# Patient Record
Sex: Female | Born: 1975
Health system: Southern US, Community
[De-identification: ages and names within clinical notes are randomized; demographics above are authoritative.]

## PROBLEM LIST (undated history)

## (undated) DIAGNOSIS — I471 Supraventricular tachycardia, unspecified: Secondary | ICD-10-CM

## (undated) DIAGNOSIS — D573 Sickle-cell trait: Secondary | ICD-10-CM

## (undated) DIAGNOSIS — G43909 Migraine, unspecified, not intractable, without status migrainosus: Secondary | ICD-10-CM

## (undated) DIAGNOSIS — K589 Irritable bowel syndrome without diarrhea: Secondary | ICD-10-CM

## (undated) DIAGNOSIS — K219 Gastro-esophageal reflux disease without esophagitis: Secondary | ICD-10-CM

## (undated) HISTORY — PX: UTERINE FIBROID SURGERY: SHX826

## (undated) HISTORY — PX: COLONOSCOPY: SHX174

## (undated) HISTORY — PX: CHOLECYSTECTOMY: SHX55

## (undated) HISTORY — DX: Supraventricular tachycardia: I47.1

## (undated) HISTORY — DX: Sickle-cell trait: D57.3

## (undated) HISTORY — DX: Irritable bowel syndrome, unspecified: K58.9

## (undated) HISTORY — PX: ESOPHAGOGASTRODUODENOSCOPY: SHX1529

## (undated) HISTORY — DX: Supraventricular tachycardia, unspecified: I47.10

## (undated) HISTORY — PX: ABDOMINAL HYSTERECTOMY: SHX81

---

## 1997-05-30 HISTORY — PX: DILATION AND CURETTAGE OF UTERUS: SHX78

## 1999-01-25 ENCOUNTER — Other Ambulatory Visit: Admission: RE | Admit: 1999-01-25 | Discharge: 1999-01-25 | Payer: Self-pay | Admitting: Obstetrics and Gynecology

## 1999-07-14 ENCOUNTER — Encounter: Admission: RE | Admit: 1999-07-14 | Discharge: 1999-09-07 | Payer: Self-pay | Admitting: Obstetrics and Gynecology

## 1999-07-29 ENCOUNTER — Observation Stay (HOSPITAL_COMMUNITY): Admission: AD | Admit: 1999-07-29 | Discharge: 1999-07-29 | Payer: Self-pay | Admitting: Obstetrics and Gynecology

## 1999-07-30 ENCOUNTER — Observation Stay (HOSPITAL_COMMUNITY): Admission: AD | Admit: 1999-07-30 | Discharge: 1999-07-31 | Payer: Self-pay | Admitting: Obstetrics and Gynecology

## 1999-08-04 ENCOUNTER — Inpatient Hospital Stay (HOSPITAL_COMMUNITY): Admission: AD | Admit: 1999-08-04 | Discharge: 1999-08-04 | Payer: Self-pay | Admitting: Obstetrics and Gynecology

## 1999-08-04 ENCOUNTER — Inpatient Hospital Stay (HOSPITAL_COMMUNITY): Admission: AD | Admit: 1999-08-04 | Discharge: 1999-08-07 | Payer: Self-pay | Admitting: Obstetrics and Gynecology

## 1999-09-03 ENCOUNTER — Encounter: Admission: RE | Admit: 1999-09-03 | Discharge: 1999-12-02 | Payer: Self-pay | Admitting: Obstetrics and Gynecology

## 1999-09-21 ENCOUNTER — Other Ambulatory Visit: Admission: RE | Admit: 1999-09-21 | Discharge: 1999-09-21 | Payer: Self-pay | Admitting: Obstetrics and Gynecology

## 2000-10-13 ENCOUNTER — Other Ambulatory Visit: Admission: RE | Admit: 2000-10-13 | Discharge: 2000-10-13 | Payer: Self-pay | Admitting: Obstetrics and Gynecology

## 2003-05-31 HISTORY — PX: TUBAL LIGATION: SHX77

## 2014-03-19 ENCOUNTER — Emergency Department (HOSPITAL_COMMUNITY)
Admission: EM | Admit: 2014-03-19 | Discharge: 2014-03-19 | Disposition: A | Discharge status: Home / Self Care | Payer: 59 | Source: Home / Self Care | Attending: Family Medicine | Admitting: Family Medicine

## 2014-03-19 ENCOUNTER — Encounter (HOSPITAL_COMMUNITY): Payer: Self-pay | Admitting: Emergency Medicine

## 2014-03-19 DIAGNOSIS — J0100 Acute maxillary sinusitis, unspecified: Secondary | ICD-10-CM

## 2014-03-19 HISTORY — DX: Migraine, unspecified, not intractable, without status migrainosus: G43.909

## 2014-03-19 HISTORY — DX: Gastro-esophageal reflux disease without esophagitis: K21.9

## 2014-03-19 MED ORDER — MINOCYCLINE HCL 100 MG PO CAPS: 100.0000 mg | ORAL_CAPSULE | Freq: Two times a day (BID) | ORAL | Status: DC

## 2014-03-19 MED ORDER — IPRATROPIUM BROMIDE 0.06 % NA SOLN: 2.0000 | Freq: Four times a day (QID) | NASAL | Status: DC

## 2014-03-19 NOTE — ED Provider Notes (Signed)
CSN: [id number]     Arrival date & time 03/19/14  1656 History   First MD Initiated Contact with Patient 03/19/14 1714     Chief Complaint  Patient presents with  . Headache   (Consider location/radiation/quality/duration/timing/severity/associated sxs/prior Treatment) Patient is a 38 y.o. female presenting with headaches. The history is provided by the patient.  Headache Pain location:  Frontal Quality:  Dull Radiates to:  Does not radiate Onset quality:  Gradual Duration:  1 week Progression:  Worsening Chronicity:  New Similar to prior headaches: no   Associated symptoms: congestion, drainage, nausea and sinus pressure   Associated symptoms: no fever and no vomiting     Past Medical History  Diagnosis Date  . GERD (gastroesophageal reflux disease)   . Migraine headache    Past Surgical History  Procedure Laterality Date  . Cholecystectomy    . Abdominal hysterectomy     History reviewed. No pertinent family history. History  Substance Use Topics  . Smoking status: Never Smoker   . Smokeless tobacco: Not on file  . Alcohol Use: Yes   OB History   Grav Para Term Preterm Abortions TAB SAB Ect Mult Living                 Review of Systems  Constitutional: Negative.  Negative for fever.  HENT: Positive for congestion, postnasal drip, rhinorrhea and sinus pressure.   Cardiovascular: Negative.   Gastrointestinal: Positive for nausea. Negative for vomiting.  Skin: Negative.   Neurological: Positive for headaches.    Allergies  Review of patient's allergies indicates no known allergies.  Home Medications   Prior to Admission medications   Medication Sig Start Date End Date Taking? Authorizing Provider  eletriptan (RELPAX) 20 MG tablet Take 20 mg by mouth as needed for migraine or headache. One tablet by mouth at onset of headache. May repeat in 2 hours if headache persists or recurs.   Yes Historical Provider, MD  omeprazole (PRILOSEC) 20 MG capsule Take 20 mg  by mouth daily.   Yes Historical Provider, MD  ipratropium (ATROVENT) 0.06 % nasal spray Place 2 sprays into both nostrils 4 (four) times daily. 03/19/14   Linna HoffJames D Domique Clapper, MD  minocycline (MINOCIN,DYNACIN) 100 MG capsule Take 1 capsule (100 mg total) by mouth 2 (two) times daily. 03/19/14   Linna HoffJames D Haidyn Kilburg, MD   BP 144/82  Pulse 71  Temp(Src) 98.2 F (36.8 C) (Oral)  Resp 18  SpO2 100% Physical Exam  Nursing note and vitals reviewed. Constitutional: She is oriented to person, place, and time. She appears well-developed and well-nourished.  HENT:  Head: Normocephalic.  Right Ear: External ear normal.  Left Ear: External ear normal.  Nose: Mucosal edema and rhinorrhea present.  Mouth/Throat: Oropharynx is clear and moist. No oropharyngeal exudate.  Neck: Normal range of motion. Neck supple.  Lymphadenopathy:    She has no cervical adenopathy.  Neurological: She is alert and oriented to person, place, and time.  Skin: Skin is warm and dry.    ED Course  Procedures (including critical care time) Labs Review Labs Reviewed - No data to display  Imaging Review No results found.   MDM   1. Acute maxillary sinusitis, recurrence not specified        Linna HoffJames D Chatham Howington, MD 03/19/14 1759

## 2014-03-19 NOTE — ED Notes (Signed)
C/o HA, nausea, dizzy, facial pain x 1 week. Hist ory of MHA, but this is not like a MHA; no photophobia or problems w noise, no relief w her MHA medication

## 2015-06-23 ENCOUNTER — Ambulatory Visit (INDEPENDENT_AMBULATORY_CARE_PROVIDER_SITE_OTHER): Payer: 59 | Admitting: Neurology

## 2015-06-23 ENCOUNTER — Encounter: Payer: Self-pay | Admitting: Neurology

## 2015-06-23 VITALS — BP 126/78 | HR 80 | Ht 64.0 in | Wt 163.0 lb

## 2015-06-23 DIAGNOSIS — G43709 Chronic migraine without aura, not intractable, without status migrainosus: Secondary | ICD-10-CM | POA: Diagnosis not present

## 2015-06-23 DIAGNOSIS — IMO0002 Reserved for concepts with insufficient information to code with codable children: Secondary | ICD-10-CM | POA: Insufficient documentation

## 2015-06-23 MED ORDER — RIZATRIPTAN BENZOATE 10 MG PO TBDP: 10.0000 mg | ORAL_TABLET | ORAL | Status: DC | PRN

## 2015-06-23 MED ORDER — NORTRIPTYLINE HCL 10 MG PO CAPS: ORAL_CAPSULE | ORAL | Status: DC

## 2015-06-23 MED FILL — RIZATRIPTAN 10 MG ODT: 10 | 30 days supply | Qty: 9 | Fill #0

## 2015-06-23 MED FILL — NORTRIPTYLINE HCL 10 MG CAP: 10 | 30 days supply | Qty: 60 | Fill #0

## 2015-06-23 NOTE — Patient Instructions (Signed)
Magnesium oxide 400 mg twice a day Riboflavin  100 mg twice a day 

## 2015-06-23 NOTE — Progress Notes (Signed)
PATIENT: Lisa Baxter DOB: 12/21/1975  Chief Complaint  Patient presents with  . Migraine    She was diagnosed with migraines 15 years ago. She has tried propranolol and topiramate in the past.  She felt topiramate was helpful but she stopped taking it years ago.  Now her headache frequency has worsened.  Reports getting 1-2 per month, lasting 2-3 days each. Imitrex was not helpful but she feels Relpax tends to resolve the pain.     HISTORICAL  Lisa Baxter is a 40 years old right-handed female, seen in refer by her primary care Dr. Foye Deer for evaluation of chronic migraine  She reported a history of migraine since 2002, her typical migraine are lateralized severe pounding headache with associated light noise sensitivity, nausea or vomiting, she has migraine 4-5  times each year.  Since 2015, she has increased headaches, couple times each months, trigger for her migraines are weather change, stress, sleep deprivation, hungry, her headache can last for a few hours to 3-5 days,  She tried Imitrex in the past, works well, currently taking Relpax, take few hours to take effect, previously she has tried Topamax as preventive medication, which does help her headaches, but she complains of slow thinking,  REVIEW OF SYSTEMS: Full 14 system review of systems performed and notable only for insomnia, headaches, not enough sleep, decreased energy, anemia, easy bruising, feeling cold, constipation, fatigue  ALLERGIES: No Known Allergies  HOME MEDICATIONS: Current Outpatient Prescriptions  Medication Sig Dispense Refill  . DEXILANT 60 MG capsule   3  . eletriptan (RELPAX) 40 MG tablet Take 40 mg by mouth as needed for migraine or headache. May repeat in 2 hours if headache persists or recurs.     No current facility-administered medications for this visit.    PAST MEDICAL HISTORY: Past Medical History  Diagnosis Date  . GERD (gastroesophageal reflux disease)   . Migraine headache      PAST SURGICAL HISTORY: Past Surgical History  Procedure Laterality Date  . Cholecystectomy    . Abdominal hysterectomy      FAMILY HISTORY: Family History  Problem Relation Age of Onset  . Prostate cancer Father   . Hypertension Father   . Diabetes Father   . Hypertension Mother   . Asthma Mother     SOCIAL HISTORY:  Social History   Social History  . Marital Status: Married    Spouse Name: N/A  . Number of Children: 2  . 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 Smoking status: Never Smoker   . Smokeless tobacco: Not on file  . Alcohol Use: 0.0 oz/week    0 Standard drinks or equivalent per week     Comment: 2- 3 times weekly - small amount  . Drug Use: No  . Sexual Activity: Not on file   Other Topics Concern  . Not on file   Social History Narrative   Lives at home with her husband and two children.   Right-handed.   Drinks small amount of caffeine 2-3 times weekly.     PHYSICAL EXAM   Filed Vitals:   06/23/15 0913  BP: 126/78  Pulse: 80  Height:  (1.626 m)  Weight: 163 lb (73.936 kg)    Not recorded      Body mass index is 27.97 kg/(m^2).  PHYSICAL EXAMNIATION:  Gen: NAD, conversant, well nourised, obese, well groomed  Cardiovascular: Regular rate rhythm, no peripheral edema, warm, nontender. Eyes: Conjunctivae clear without exudates or hemorrhage Neck: Supple, no carotid bruise. Pulmonary: Clear to auscultation bilaterally   NEUROLOGICAL EXAM:  MENTAL STATUS: Speech:    Speech is normal; fluent and spontaneous with normal comprehension.  Cognition:     Orientation to time, place and person     Normal recent and remote memory     Normal Attention span and concentration     Normal Language, naming, repeating,spontaneous speech     Fund of knowledge   CRANIAL NERVES: CN II: Visual fields are full to confrontation. Fundoscopic exam is normal with  sharp discs and no vascular changes. Pupils are round equal and briskly reactive to light. CN III, IV, VI: extraocular movement are normal. No ptosis. CN V: Facial sensation is intact to pinprick in all 3 divisions bilaterally. Corneal responses are intact.  CN VII: Face is symmetric with normal eye closure and smile. CN VIII: Hearing is normal to rubbing fingers CN IX, X: Palate elevates symmetrically. Phonation is normal. CN XI: Head turning and shoulder shrug are intact CN XII: Tongue is midline with normal movements and no atrophy.  MOTOR: There is no pronator drift of out-stretched arms. Muscle bulk and tone are normal. Muscle strength is normal.  REFLEXES: Reflexes are 2+ and symmetric at the biceps, triceps, knees, and ankles. Plantar responses are flexor.  SENSORY: Intact to light touch, pinprick, position sense, and vibration sense are intact in fingers and toes.  COORDINATION: Rapid alternating movements and fine finger movements are intact. There is no dysmetria on finger-to-nose and heel-knee-shin.    GAIT/STANCE: Posture is normal. Gait is steady with normal steps, base, arm swing, and turning. Heel and toe walking are normal. Tandem gait is normal.  Romberg is absent.   DIAGNOSTIC DATA (LABS, IMAGING, TESTING) - I reviewed patient records, labs, notes, testing and imaging myself where available.   ASSESSMENT AND PLAN  Lisa Baxter is a 40 y.o. female     Chronic migraine  I have suggested preventive medications magnesium oxide 400 mg twice a day, riboflavin 100 mg twice a day  Nortriptyline 10 mg titrating to 20 mg every night  Maxalt as needed  Avoid trigger   Levert Feinstein, M.D. Ph.D.  [facility] [address] [place], [place] [address] Ph: [phone] Fax: [phone]  CC: Joycelyn Rua, MD

## 2015-07-30 MED FILL — RIZATRIPTAN 10 MG ODT: 10 | 30 days supply | Qty: 9 | Fill #1

## 2015-07-30 MED FILL — NORTRIPTYLINE HCL 10 MG CAP: 10 | 30 days supply | Qty: 60 | Fill #1

## 2015-08-27 ENCOUNTER — Telehealth: Payer: Self-pay | Admitting: Neurology

## 2015-08-27 NOTE — Telephone Encounter (Signed)
Called and spoke to patient about FM LA fee and patient relayed she will call back if she wants form filled out. Patient is not sure at this time. I relayed to patient to please call back if she need us form 10-14 business day turn around.

## 2015-09-17 ENCOUNTER — Emergency Department (HOSPITAL_COMMUNITY)
Admission: EM | Admit: 2015-09-17 | Discharge: 2015-09-17 | Disposition: A | Discharge status: Home / Self Care | Payer: 59 | Attending: Emergency Medicine | Admitting: Emergency Medicine

## 2015-09-17 ENCOUNTER — Encounter (HOSPITAL_COMMUNITY): Payer: Self-pay | Admitting: Emergency Medicine

## 2015-09-17 DIAGNOSIS — Z79899 Other long term (current) drug therapy: Secondary | ICD-10-CM | POA: Insufficient documentation

## 2015-09-17 DIAGNOSIS — K219 Gastro-esophageal reflux disease without esophagitis: Secondary | ICD-10-CM | POA: Insufficient documentation

## 2015-09-17 DIAGNOSIS — G43009 Migraine without aura, not intractable, without status migrainosus: Secondary | ICD-10-CM | POA: Insufficient documentation

## 2015-09-17 DIAGNOSIS — G43709 Chronic migraine without aura, not intractable, without status migrainosus: Secondary | ICD-10-CM | POA: Diagnosis not present

## 2015-09-17 DIAGNOSIS — G43909 Migraine, unspecified, not intractable, without status migrainosus: Secondary | ICD-10-CM | POA: Diagnosis present

## 2015-09-17 MED ORDER — METOCLOPRAMIDE HCL 5 MG/ML IJ SOLN
10.0000 mg | Freq: Once | INTRAMUSCULAR | Status: AC
  Administered 2015-09-17: 10 mg via INTRAMUSCULAR
  Filled 2015-09-17: qty 2

## 2015-09-17 MED ORDER — DEXAMETHASONE SODIUM PHOSPHATE 10 MG/ML IJ SOLN
10.0000 mg | Freq: Once | INTRAMUSCULAR | Status: AC
  Administered 2015-09-17: 10 mg via INTRAMUSCULAR
  Filled 2015-09-17: qty 1

## 2015-09-17 MED ORDER — DIPHENHYDRAMINE HCL 50 MG/ML IJ SOLN
25.0000 mg | Freq: Once | INTRAMUSCULAR | Status: AC
  Administered 2015-09-17: 25 mg via INTRAMUSCULAR
  Filled 2015-09-17: qty 1

## 2015-09-17 NOTE — ED Notes (Signed)
MD at bedside. 

## 2015-09-17 NOTE — ED Notes (Signed)
Pt brought in by EMS with c/o migraine headache that started about 7am  Pt states she has had nausea, vomiting, sensitivity to light and noise  [facility]  Pt states her pain is now a 8/10

## 2015-09-17 NOTE — Discharge Instructions (Signed)
Recurrent Migraine Headache A migraine headache is an intense, throbbing pain on one or both sides of your head. Recurrent migraines keep coming back. A migraine can last for 30 minutes to several hours. CAUSES  The exact cause of a migraine headache is not always known. However, a migraine may be caused when nerves in the brain become irritated and release chemicals that cause inflammation. This causes pain. Certain things may also trigger migraines, such as:   Alcohol.  Smoking.  Stress.  Menstruation.  Aged cheeses.  Foods or drinks that contain nitrates, glutamate, aspartame, or tyramine.  Lack of sleep.  Chocolate.  Caffeine.  Hunger.  Physical exertion.  Fatigue.  Medicines used to treat chest pain (nitroglycerine), birth control pills, estrogen, and some blood pressure medicines. SYMPTOMS   Pain on one or both sides of your head.  Pulsating or throbbing pain.  Severe pain that prevents daily activities.  Pain that is aggravated by any physical activity.  Nausea, vomiting, or both.  Dizziness.  Pain with exposure to bright lights, loud noises, or activity.  General sensitivity to bright lights, loud noises, or smells. Before you get a migraine, you may get warning signs that a migraine is coming (aura). An aura may include:  Seeing flashing lights.  Seeing bright spots, halos, or zigzag lines.  Having tunnel vision or blurred vision.  Having feelings of numbness or tingling.  Having trouble talking.  Having muscle weakness. DIAGNOSIS  A recurrent migraine headache is often diagnosed based on:  Symptoms.  Physical examination.  A CT scan or MRI of your head. These imaging tests cannot diagnose migraines but can help rule out other causes of headaches.  TREATMENT  Medicines may be given for pain and nausea. Medicines can also be given to help prevent recurrent migraines. HOME CARE INSTRUCTIONS  Only take over-the-counter or prescription  medicines for pain or discomfort as directed by your health care provider. The use of long-term narcotics is not recommended.  Lie down in a dark, quiet room when you have a migraine.  Keep a journal to find out what may trigger your migraine headaches. For example, write down:  What you eat and drink.  How much sleep you get.  Any change to your diet or medicines.  Limit alcohol consumption.  Quit smoking if you smoke.  Get 7-9 hours of sleep, or as recommended by your health care provider.  Limit stress.  Keep lights dim if bright lights bother you and make your migraines worse. SEEK MEDICAL CARE IF:   You do not get relief from the medicines given to you.  You have a recurrence of pain.  You have a fever. SEEK IMMEDIATE MEDICAL CARE IF:  Your migraine becomes severe.  You have a stiff neck.  You have loss of vision.  You have muscular weakness or loss of muscle control.  You start losing your balance or have trouble walking.  You feel faint or pass out.  You have severe symptoms that are different from your first symptoms. MAKE SURE YOU:   Understand these instructions.  Will watch your condition.  Will get help right away if you are not doing well or get worse.   This information is not intended to replace advice given to you by your health care provider. Make sure you discuss any questions you have with your health care provider.   Document Released: 02/08/2001 Document Revised: 06/06/2014 Document Reviewed: 01/21/2013 Elsevier Interactive Patient Education 2016 [employer]  

## 2015-09-17 NOTE — ED Provider Notes (Signed)
CSN: [id number]     Arrival date & time 09/17/15  2110 History   First MD Initiated Contact with Patient 09/17/15 2138     Chief Complaint  Patient presents with  . Migraine     (Consider location/radiation/quality/duration/timing/severity/associated sxs/prior Treatment) Patient is a 40 y.o. female presenting with migraines. The history is provided by the patient.  Migraine This is a recurrent problem. The current episode started 12 to 24 hours ago. The problem occurs constantly. The problem has not changed since onset.Associated symptoms include headaches. Nothing aggravates the symptoms. Nothing relieves the symptoms. Treatments tried: topradol at Dr's office. The treatment provided mild relief.    Past Medical History  Diagnosis Date  . GERD (gastroesophageal reflux disease)   . Migraine headache    Past Surgical History  Procedure Laterality Date  . Cholecystectomy    . Abdominal hysterectomy     Family History  Problem Relation Age of Onset  . Prostate cancer Father   . Hypertension Father   . Diabetes Father   . Hypertension Mother   . Asthma Mother    Social History  Substance Use Topics  . Smoking status: Never Smoker   . Smokeless tobacco: None  . Alcohol Use: 0.0 oz/week    0 Standard drinks or equivalent per week     Comment: 2- 3 times weekly - small amount   OB History    No data available     Review of Systems  Neurological: Positive for headaches.  All other systems reviewed and are negative.     Allergies  Review of patient's allergies indicates no known allergies.  Home Medications   Prior to Admission medications   Medication Sig Start Date End Date Taking? Authorizing Provider  DEXILANT 60 MG capsule Take 60 mg by mouth daily.  05/15/15  Yes Historical Provider, MD  rizatriptan (MAXALT-MLT) 10 MG disintegrating tablet Take 1 tablet (10 mg total) by mouth as needed. May repeat in 2 hours if needed Patient taking differently: Take 10 mg by  mouth as needed for migraine. May repeat in 2 hours if needed 06/23/15  Yes Levert Feinstein, MD  nortriptyline (PAMELOR) 10 MG capsule 1 tablet every night for one week, then 2 tablets every night Patient not taking: Reported on 09/17/2015 06/23/15   Levert Feinstein, MD   BP 126/90 mmHg  Pulse 79  Temp(Src) 98.3 F (36.8 C) (Oral)  Resp 14  SpO2 97% Physical Exam  Constitutional: She is oriented to person, place, and time. She appears well-developed and well-nourished. No distress.  HENT:  Head: Normocephalic.  Eyes: Conjunctivae are normal.  Neck: Neck supple. No tracheal deviation present.  Cardiovascular: Normal rate and regular rhythm.   Pulmonary/Chest: Effort normal. No respiratory distress.  Abdominal: Soft. She exhibits no distension.  Neurological: She is alert and oriented to person, place, and time. She has normal strength. No cranial nerve deficit. GCS eye subscore is 4. GCS verbal subscore is 5. GCS motor subscore is 6.  Normal finger to nose testing and rapid alternating movement   Skin: Skin is warm and dry.  Psychiatric: She has a normal mood and affect.  Vitals reviewed.   ED Course  Procedures (including critical care time) Labs Review Labs Reviewed - No data to display  Imaging Review No results found. I have personally reviewed and evaluated these images and lab results as part of my medical decision-making.   EKG Interpretation None      MDM   Final diagnoses:  Migraine without aura and without status migrainosus, not intractable    40 y.o. female presents with migraine headache that is similar to prior starting this morning. Not responding to supportive care measures at home. No neurologic deficits here and otherwise well appearing despite discomfort. No red flag symptoms for headache, no signs or symptoms of spontaneous ICH. Provided migraine cocktail with good relief of symptoms. Plan to follow up with PCP as needed and return precautions discussed for worsening  or new concerning symptoms.     Lyndal Pulleyaniel Lucynda Rosano, MD 09/17/15 [phone]

## 2015-09-21 ENCOUNTER — Encounter: Payer: Self-pay | Admitting: Neurology

## 2015-09-21 ENCOUNTER — Ambulatory Visit (INDEPENDENT_AMBULATORY_CARE_PROVIDER_SITE_OTHER): Payer: 59 | Admitting: Neurology

## 2015-09-21 ENCOUNTER — Other Ambulatory Visit: Payer: Self-pay | Admitting: Neurology

## 2015-09-21 VITALS — BP 124/87 | HR 77 | Ht 64.0 in | Wt 168.0 lb

## 2015-09-21 DIAGNOSIS — IMO0002 Reserved for concepts with insufficient information to code with codable children: Secondary | ICD-10-CM

## 2015-09-21 DIAGNOSIS — R519 Headache, unspecified: Secondary | ICD-10-CM

## 2015-09-21 DIAGNOSIS — R51 Headache: Secondary | ICD-10-CM | POA: Diagnosis not present

## 2015-09-21 DIAGNOSIS — G43709 Chronic migraine without aura, not intractable, without status migrainosus: Secondary | ICD-10-CM | POA: Diagnosis not present

## 2015-09-21 MED ORDER — SUMATRIPTAN SUCCINATE 6 MG/0.5ML ~~LOC~~ SOLN: SUBCUTANEOUS | Status: DC

## 2015-09-21 MED ORDER — TOPIRAMATE 100 MG PO TABS: 100.0000 mg | ORAL_TABLET | Freq: Two times a day (BID) | ORAL | Status: DC

## 2015-09-21 MED ORDER — ONDANSETRON 4 MG PO TBDP: 4.0000 mg | ORAL_TABLET | Freq: Three times a day (TID) | ORAL | Status: DC | PRN

## 2015-09-21 MED FILL — ONDANSETRON ODT 4 MG TABLET: 4 | 6 days supply | Qty: 20 | Fill #0

## 2015-09-21 MED FILL — TOPIRAMATE 100 MG TABLET: 100 | 30 days supply | Qty: 60 | Fill #0

## 2015-09-21 NOTE — Progress Notes (Signed)
Chief Complaint  Patient presents with  . Migraine    She is having daily headaches and 2-3 migraines per month.  She stopped taking nortriptyline due to weight gain and decrease libido.  She was treated in the ED on 08/2015 for a severe migraine.      PATIENT: Lisa Baxter DOB: 1975/12/29  Chief Complaint  Patient presents with  . Migraine    She is having daily headaches and 2-3 migraines per month.  She stopped taking nortriptyline due to weight gain and decrease libido.  She was treated in the ED on 08/2015 for a severe migraine.     HISTORICAL  Lisa Rancherrica R Yott is a 40 years old right-handed female, seen in refer by her primary care Dr. Foye DeerSteven Meyer for evaluation of chronic migraine  She reported a history of migraine since 2002, her typical migraine are lateralized severe pounding headache with associated light noise sensitivity, nausea or vomiting, she has migraine 4-5  times each year.  Since 2015, she has increased headaches, couple times each months, trigger for her migraines are weather change, stress, sleep deprivation, hungry, her headache can last for a few hours to 3-5 days,  She tried Imitrex in the past, works well, currently taking Relpax, take few hours to take effect, previously she has tried Topamax as preventive medication, which does help her headaches, but she complains of slow thinking,  UPDATE September 21 2015: She is taking magnesium oxide and riboflavin twice daily, noticed mild improvement of her headache, but still frequent, she could not tolerate nortriptyline, complains of difficulty sleeping, decreased sex drive, weight gain, she has stopped taking nortriptyline now, above-mentioned symptoms has improved.  She has 2 or 3 mild to moderate headache each week, sometimes old exacerbated to a more severe migraine headaches, her mild headache responding well to Maxalt, in September 16 2015, she experienced severe prolonged headaches, presented to emergency room September 17 2015, received Benadryl, Compazine, Decadron, which helped her headache,   REVIEW OF SYSTEMS: Full 14 system review of systems performed and notable only for appetite change, unexpected weight change, blurry vision, constipation, nausea, insomnia, cold intolerance, moles, bruise easily, anemia headaches  ALLERGIES: No Known Allergies  HOME MEDICATIONS: Current Outpatient Prescriptions  Medication Sig Dispense Refill  . DEXILANT 60 MG capsule Take 60 mg by mouth daily.   3  . magnesium oxide (MAG-OX) 400 MG tablet Take 400 mg by mouth 2 (two) times daily.    . Riboflavin 100 MG CAPS Take by mouth 2 (two) times daily.    . rizatriptan (MAXALT-MLT) 10 MG disintegrating tablet Take 1 tablet (10 mg total) by mouth as needed. May repeat in 2 hours if needed (Patient taking differently: Take 10 mg by mouth as needed for migraine. May repeat in 2 hours if needed) 15 tablet 11   No current facility-administered medications for this visit.    PAST MEDICAL HISTORY: Past Medical History  Diagnosis Date  . GERD (gastroesophageal reflux disease)   . Migraine headache     PAST SURGICAL HISTORY: Past Surgical History  Procedure Laterality Date  . Cholecystectomy    . Abdominal hysterectomy      FAMILY HISTORY: Family History  Problem Relation Age of Onset  . Prostate cancer Father   . Hypertension Father   . Diabetes Father   . Hypertension Mother   . Asthma Mother     SOCIAL HISTORY:  Social History   Social History  . Marital Status: Married  Spouse Name: N/A  . Number of Children: 2  . 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 Smoking status: Never Smoker   . Smokeless tobacco: Not on file  . Alcohol Use: 0.0 oz/week    0 Standard drinks or equivalent per week     Comment: 2- 3 times weekly - small amount  . Drug Use: No  . Sexual Activity: Not on file   Other Topics Concern  . Not on file   Social  History Narrative   Lives at home with her husband and two children.   Right-handed.   Drinks small amount of caffeine 2-3 times weekly.     PHYSICAL EXAM   Filed Vitals:   09/21/15 0910  BP: 124/87  Pulse: 77  Height:  (1.626 m)  Weight: 168 lb (76.204 kg)    Not recorded      Body mass index is 28.82 kg/(m^2).  PHYSICAL EXAMNIATION:  Gen: NAD, conversant, well nourised, obese, well groomed                     Cardiovascular: Regular rate rhythm, no peripheral edema, warm, nontender. Eyes: Conjunctivae clear without exudates or hemorrhage Neck: Supple, no carotid bruise. Pulmonary: Clear to auscultation bilaterally   NEUROLOGICAL EXAM:  MENTAL STATUS: Speech:    Speech is normal; fluent and spontaneous with normal comprehension.  Cognition:     Orientation to time, place and person     Normal recent and remote memory     Normal Attention span and concentration     Normal Language, naming, repeating,spontaneous speech     Fund of knowledge   CRANIAL NERVES: CN II: Visual fields are full to confrontation. Fundoscopic exam is normal with sharp discs and no vascular changes. Pupils are round equal and briskly reactive to light. CN III, IV, VI: extraocular movement are normal. No ptosis. CN V: Facial sensation is intact to pinprick in all 3 divisions bilaterally. Corneal responses are intact.  CN VII: Face is symmetric with normal eye closure and smile. CN VIII: Hearing is normal to rubbing fingers CN IX, X: Palate elevates symmetrically. Phonation is normal. CN XI: Head turning and shoulder shrug are intact CN XII: Tongue is midline with normal movements and no atrophy.  MOTOR: There is no pronator drift of out-stretched arms. Muscle bulk and tone are normal. Muscle strength is normal.  REFLEXES: Reflexes are 2+ and symmetric at the biceps, triceps, knees, and ankles. Plantar responses are flexor.  SENSORY: Intact to light touch, pinprick, position sense,  and vibration sense are intact in fingers and toes.  COORDINATION: Rapid alternating movements and fine finger movements are intact. There is no dysmetria on finger-to-nose and heel-knee-shin.    GAIT/STANCE: Posture is normal. Gait is steady with normal steps, base, arm swing, and turning. Heel and toe walking are normal. Tandem gait is normal.  Romberg is absent.   DIAGNOSTIC DATA (LABS, IMAGING, TESTING) - I reviewed patient records, labs, notes, testing and imaging myself where available.   ASSESSMENT AND PLAN  Lisa Baxter is a 40 y.o. female     Chronic migraine  I have suggested preventive medications magnesium oxide 400 mg twice a day, riboflavin 100 mg twice a day  She could not tolerate nortriptyline: Insomnia, weight gain, decreased sex drive,   Maxalt as needed, add on Imitrex injection, Zofran as needed for severe headaches  Acute sudden onset severe headaches  In September 16 2015, failed to response to Maxalt, with associated blurry vision, after discussed with patient, she desires further imaging study to rule out structural lesion. Proceed with MRI of the brain  Levert Feinstein, M.D. Ph.D.  [facility] [address], [address] [place], [place] [address] Ph: [phone] Fax: [phone]  CC: Joycelyn Rua, MD

## 2015-09-21 NOTE — Patient Instructions (Signed)
Topamax 100 mg tablet  Starting half tablet twice a day for 1 week, then 1 tablet twice a day   Maximum 2 tablets of Maxalt 10 mg in 24 hours, 2 hours apart,  For severe migraine headaches, may use Imitrex injection as needed, along with Zofran as needed

## 2015-09-23 ENCOUNTER — Other Ambulatory Visit: Payer: Self-pay | Admitting: *Deleted

## 2015-09-23 ENCOUNTER — Telehealth: Payer: Self-pay | Admitting: Neurology

## 2015-09-23 MED ORDER — SUMATRIPTAN SUCCINATE 6 MG/0.5ML ~~LOC~~ SOAJ: SUBCUTANEOUS | Status: DC

## 2015-09-23 MED ORDER — SUMATRIPTAN SUCCINATE REFILL 6 MG/0.5ML ~~LOC~~ SOCT: SUBCUTANEOUS | Status: DC

## 2015-09-23 MED FILL — SUMATRIPTAN 6 MG/0.5 ML REF: 6 | 30 days supply | Qty: 5 | Fill #0

## 2015-09-23 MED FILL — SUMATRIPTAN 6 MG/0.5 ML INJ: 6 | 5 days supply | Qty: 1 | Fill #0

## 2015-09-23 NOTE — Telephone Encounter (Signed)
Spoke to [place] - rx for kit refills needed rather than prefilled syringes - new rx sent electronically for same dosage as previous script - all other forms/precriptions for this medication voided.

## 2015-09-23 NOTE — Telephone Encounter (Signed)
Da[phne with [facility] is calling to get clarification on medication SUMAtriptan 6 MG/0.5ML SOAJ for the patient.

## 2015-09-29 MED FILL — RIZATRIPTAN 10 MG ODT: 10 | 30 days supply | Qty: 9 | Fill #2

## 2015-09-30 ENCOUNTER — Ambulatory Visit (INDEPENDENT_AMBULATORY_CARE_PROVIDER_SITE_OTHER): Payer: 59

## 2015-09-30 DIAGNOSIS — R51 Headache: Secondary | ICD-10-CM

## 2015-09-30 DIAGNOSIS — IMO0002 Reserved for concepts with insufficient information to code with codable children: Secondary | ICD-10-CM

## 2015-09-30 DIAGNOSIS — G43709 Chronic migraine without aura, not intractable, without status migrainosus: Secondary | ICD-10-CM | POA: Diagnosis not present

## 2015-09-30 DIAGNOSIS — R519 Headache, unspecified: Secondary | ICD-10-CM

## 2015-10-07 ENCOUNTER — Other Ambulatory Visit: Payer: 59

## 2015-10-08 ENCOUNTER — Telehealth: Payer: Self-pay | Admitting: Neurology

## 2015-10-08 NOTE — Telephone Encounter (Signed)
Please call patient, I have sent MRI report to her 'my chart'  MRI of the brain showed mild Arnold-Chiari malformation, what is a congenital variations, there is no evidence of compression, essentially normal MRI of the brain

## 2015-10-08 NOTE — Telephone Encounter (Signed)
Spoke to patient - she is aware of results and will keep her pending follow up appt to further discuss results.

## 2015-10-08 NOTE — Telephone Encounter (Signed)
Pt request MRI results.  °

## 2015-10-22 ENCOUNTER — Telehealth: Payer: Self-pay

## 2015-10-22 ENCOUNTER — Ambulatory Visit: Payer: Self-pay | Admitting: Neurology

## 2015-10-22 NOTE — Telephone Encounter (Signed)
Patient did not show to appt today  

## 2015-10-23 ENCOUNTER — Encounter: Payer: Self-pay | Admitting: Neurology

## 2015-11-05 ENCOUNTER — Ambulatory Visit (INDEPENDENT_AMBULATORY_CARE_PROVIDER_SITE_OTHER): Payer: 59 | Admitting: Neurology

## 2015-11-05 ENCOUNTER — Encounter: Payer: Self-pay | Admitting: Neurology

## 2015-11-05 VITALS — BP 126/85 | HR 80 | Ht 64.0 in | Wt 168.0 lb

## 2015-11-05 DIAGNOSIS — G43709 Chronic migraine without aura, not intractable, without status migrainosus: Secondary | ICD-10-CM | POA: Diagnosis not present

## 2015-11-05 DIAGNOSIS — IMO0002 Reserved for concepts with insufficient information to code with codable children: Secondary | ICD-10-CM

## 2015-11-05 NOTE — Progress Notes (Signed)
Chief Complaint  Patient presents with  . Migraine    She would like to review her MRI results.  Reports two migraines since her last office visit, on 09/21/15, that responded well to Maxalt.  She is only taking Topamax  once daily.  The twice daily dosing caused her decreased concentration that interfered with work.      PATIENT: Lisa Baxter DOB: 04-08-1976  Chief Complaint  Patient presents with  . Migraine    She would like to review her MRI results.  Reports two migraines since her last office visit, on 09/21/15, that responded well to Maxalt.  She is only taking Topamax  once daily.  The twice daily dosing caused her decreased concentration that interfered with work.     HISTORICAL  Lisa Baxter is a 40 years old right-handed female, seen in refer by her primary care Dr. Foye Deer for evaluation of chronic migraine  She reported a history of migraine since 2002, her typical migraine are lateralized severe pounding headache with associated light noise sensitivity, nausea or vomiting, she has migraine 4-5  times each year.  Since 2015, she has increased headaches, couple times each months, trigger for her migraines are weather change, stress, sleep deprivation, hungry, her headache can last for a few hours to 3-5 days,  She tried Imitrex in the past, works well, currently taking Relpax, take few hours to take effect, previously she has tried Topamax as preventive medication, which does help her headaches, but she complains of slow thinking,  UPDATE September 21 2015: She is taking magnesium oxide and riboflavin twice daily, noticed mild improvement of her headache, but still frequent, she could not tolerate nortriptyline, complains of difficulty sleeping, decreased sex drive, weight gain, she has stopped taking nortriptyline now, above-mentioned symptoms has improved.  She has 2 or 3 mild to moderate headache each week, sometimes old exacerbated to a more severe migraine  headaches, her mild headache responding well to Maxalt, in September 16 2015, she experienced severe prolonged headaches, presented to emergency room September 17 2015, received Benadryl, Compazine, Decadron, which helped her headache,  UPDATE June 8th 2017: She could not tolerate Topamax 100 mg twice a day, cause confusion, slow thinking, she is now only taking 100 mg every night, Maxalt works for her migraine, but she has to take multiple doses, she has not tried Imitrex yet  We have personally reviewed MRI of the brain without contrast in May 2017, mild Arnold-Chiari malformation, normal variant, no compression.   REVIEW OF SYSTEMS: Full 14 system review of systems performed and notable only for fatigue, cold intolerance, insomnia, frequent wakening, headaches  ALLERGIES: No Known Allergies  HOME MEDICATIONS: Current Outpatient Prescriptions  Medication Sig Dispense Refill  . DEXILANT 60 MG capsule Take 60 mg by mouth daily.   3  . magnesium oxide (MAG-OX) 400 MG tablet Take 400 mg by mouth 2 (two) times daily.    . ondansetron (ZOFRAN ODT) 4 MG disintegrating tablet Take 1 tablet (4 mg total) by mouth every 8 (eight) hours as needed for nausea or vomiting. 20 tablet 11  . Riboflavin 100 MG CAPS Take by mouth 2 (two) times daily.    . rizatriptan (MAXALT-MLT) 10 MG disintegrating tablet Take 1 tablet (10 mg total) by mouth as needed. May repeat in 2 hours if needed (Patient taking differently: Take 10 mg by mouth as needed for migraine. May repeat in 2 hours if needed) 15 tablet 11  . SUMAtriptan Succinate Refill 6  MG/0.5ML SOCT Inject 0.325ml at onset of migaine.  May repeat in two hours if headache persist or recurs. 5 mL 11  . topiramate (TOPAMAX) 100 MG tablet Take 1 tablet (100 mg total) by mouth 2 (two) times daily. 60 tablet 11   No current facility-administered medications for this visit.    PAST MEDICAL HISTORY: Past Medical History  Diagnosis Date  . GERD (gastroesophageal reflux  disease)   . Migraine headache     PAST SURGICAL HISTORY: Past Surgical History  Procedure Laterality Date  . Cholecystectomy    . Abdominal hysterectomy      FAMILY HISTORY: Family History  Problem Relation Age of Onset  . Prostate cancer Father   . Hypertension Father   . Diabetes Father   . Hypertension Mother   . Asthma Mother     SOCIAL HISTORY:  Social History   Social History  . Marital Status: Married    Spouse Name: N/A  . Number of Children: 2  . 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 Smoking status: Never Smoker   . Smokeless tobacco: Not on file  . Alcohol Use: 0.0 oz/week    0 Standard drinks or equivalent per week     Comment: 2- 3 times weekly - small amount  . Drug Use: No  . Sexual Activity: Not on file   Other Topics Concern  . Not on file   Social History Narrative   Lives at home with her husband and two children.   Right-handed.   Drinks small amount of caffeine 2-3 times weekly.     PHYSICAL EXAM   Filed Vitals:   11/05/15 0816  BP: 126/85  Pulse: 80  Height: 5\' 4"  (1.626 m)  Weight: 168 lb (76.204 kg)    Not recorded      Body mass index is 28.82 kg/(m^2).  PHYSICAL EXAMNIATION:  Gen: NAD, conversant, well nourised, obese, well groomed                     Cardiovascular: Regular rate rhythm, no peripheral edema, warm, nontender. Eyes: Conjunctivae clear without exudates or hemorrhage Neck: Supple, no carotid bruise. Pulmonary: Clear to auscultation bilaterally   NEUROLOGICAL EXAM:  MENTAL STATUS: Speech:    Speech is normal; fluent and spontaneous with normal comprehension.  Cognition:     Orientation to time, place and person     Normal recent and remote memory     Normal Attention span and concentration     Normal Language, naming, repeating,spontaneous speech     Fund of knowledge   CRANIAL NERVES: CN II: Visual fields are full to confrontation.  Fundoscopic exam is normal with sharp discs and no vascular changes. Pupils are round equal and briskly reactive to light. CN III, IV, VI: extraocular movement are normal. No ptosis. CN V: Facial sensation is intact to pinprick in all 3 divisions bilaterally. Corneal responses are intact.  CN VII: Face is symmetric with normal eye closure and smile. CN VIII: Hearing is normal to rubbing fingers CN IX, X: Palate elevates symmetrically. Phonation is normal. CN XI: Head turning and shoulder shrug are intact CN XII: Tongue is midline with normal movements and no atrophy.  MOTOR: There is no pronator drift of out-stretched arms. Muscle bulk and tone are normal. Muscle strength is normal.  REFLEXES: Reflexes are 2+ and symmetric at the biceps, triceps, knees, and ankles. Plantar  responses are flexor.  SENSORY: Intact to light touch, pinprick, position sense, and vibration sense are intact in fingers and toes.  COORDINATION: Rapid alternating movements and fine finger movements are intact. There is no dysmetria on finger-to-nose and heel-knee-shin.    GAIT/STANCE: Posture is normal. Gait is steady with normal steps, base, arm swing, and turning. Heel and toe walking are normal. Tandem gait is normal.  Romberg is absent.   DIAGNOSTIC DATA (LABS, IMAGING, TESTING) - I reviewed patient records, labs, notes, testing and imaging myself where available.   ASSESSMENT AND PLAN  Lisa Baxter is a 40 y.o. female     Chronic migraine  I have suggested preventive medications magnesium oxide 400 mg twice a day, riboflavin 100 mg twice a day  She could not tolerate nortriptyline: Insomnia, weight gain, decreased sex drive,   Topamax she is now taking 100 mg every night, higher dose would cause confusion,  Maxalt as needed for moderate headaches, add on Imitrex injection, Zofran and Benadryl as needed for severe headaches  MRI of the brain without contrast in May 2017, mild Arnold-Chiari  malformation, normal variant, no compression  Levert Feinstein, M.D. Ph.D.  [facility] [address], [address] [place], [place] [address] Ph: [phone] Fax: [phone]  CC: Joycelyn Rua, MD

## 2015-11-09 MED FILL — RIZATRIPTAN 10 MG ODT: 10 | 30 days supply | Qty: 9 | Fill #3

## 2015-11-09 MED FILL — DEXILANT DR 60 MG CAPSULE: 60 | 90 days supply | Qty: 90 | Fill #0

## 2015-11-26 ENCOUNTER — Ambulatory Visit: Payer: 59 | Admitting: Neurology

## 2015-12-02 MED FILL — TOPIRAMATE 100 MG TABLET: 100 | 30 days supply | Qty: 60 | Fill #1

## 2015-12-11 MED FILL — RIZATRIPTAN 10 MG ODT: 10 | 30 days supply | Qty: 9 | Fill #4

## 2016-01-28 MED FILL — TOPIRAMATE 100 MG TABLET: 100 | 30 days supply | Qty: 60 | Fill #2

## 2016-02-03 MED FILL — RIZATRIPTAN 10 MG ODT: 10 | 30 days supply | Qty: 9 | Fill #5

## 2016-02-08 ENCOUNTER — Telehealth: Payer: Self-pay

## 2016-02-08 NOTE — Telephone Encounter (Signed)
Pt returned RN's call, confirmed appt, she will coming tomorrow  FYI

## 2016-02-08 NOTE — Telephone Encounter (Signed)
I called Lisa Baxter to confirm appt with Dr. Terrace ArabiaYan tomorrow 9/12 at 8:30. There are some concerns about the weather. I left Lisa Baxter a message asking her to call me back. If Lisa Baxter calls back, please confirm her appt tomorrow, if not, please rescheduled her.

## 2016-02-09 ENCOUNTER — Encounter: Payer: Self-pay | Admitting: Neurology

## 2016-02-09 ENCOUNTER — Ambulatory Visit (INDEPENDENT_AMBULATORY_CARE_PROVIDER_SITE_OTHER): Payer: 59 | Admitting: Neurology

## 2016-02-09 VITALS — BP 131/79 | HR 79 | Ht 64.0 in | Wt 159.0 lb

## 2016-02-09 DIAGNOSIS — G43709 Chronic migraine without aura, not intractable, without status migrainosus: Secondary | ICD-10-CM

## 2016-02-09 DIAGNOSIS — IMO0002 Reserved for concepts with insufficient information to code with codable children: Secondary | ICD-10-CM

## 2016-02-09 MED ORDER — RIZATRIPTAN BENZOATE 10 MG PO TBDP: 10.0000 mg | ORAL_TABLET | ORAL | 4 refills | Status: DC | PRN

## 2016-02-09 NOTE — Progress Notes (Signed)
Chief Complaint  Patient presents with  . Migraine    Her migraines have increased recently but she feels it is related to the weather changes.  She tends to have blurred vision and nausea with her more severe headaches. They repsond well to her triptans, as long as she takes them early.  She is still taking Topamax 100mg , QHS.      PATIENT: Lisa Baxter DOB: 04/12/1976  Chief Complaint  Patient presents with  . Migraine    Her migraines have increased recently but she feels it is related to the weather changes.  She tends to have blurred vision and nausea with her more severe headaches. They repsond well to her triptans, as long as she takes them early.  She is still taking Topamax 100mg , QHS.     HISTORICAL  Lisa Baxter is a 40 years old right-handed female, seen in refer by her primary care Dr. Foye Deer for evaluation of chronic migraine  She reported a history of migraine since 2002, her typical migraine are lateralized severe pounding headache with associated light noise sensitivity, nausea or vomiting, she has migraine 4-5  times each year.  Since 2015, she has increased headaches, couple times each months, trigger for her migraines are weather change, stress, sleep deprivation, hungry, her headache can last for a few hours to 3-5 days,  She tried Imitrex in the past, works well, currently taking Relpax, take few hours to take effect, previously she has tried Topamax as preventive medication, which does help her headaches, but she complains of slow thinking,  UPDATE September 21 2015: She is taking magnesium oxide and riboflavin twice daily, noticed mild improvement of her headache, but still frequent, she could not tolerate nortriptyline, complains of difficulty sleeping, decreased sex drive, weight gain, she has stopped taking nortriptyline now, above-mentioned symptoms has improved.  She has 2 or 3 mild to moderate headache each week, sometimes old exacerbated to a more  severe migraine headaches, her mild headache responding well to Maxalt, in September 16 2015, she experienced severe prolonged headaches, presented to emergency room September 17 2015, received Benadryl, Compazine, Decadron, which helped her headache,  UPDATE June 8th 2017: She could not tolerate Topamax 100 mg twice a day, cause confusion, slow thinking, she is now only taking 100 mg every night, Maxalt works for her migraine, but she has to take multiple doses, she has not tried Imitrex yet  We have personally reviewed MRI of the brain without contrast in May 2017, mild Arnold-Chiari malformation, normal variant, no compression.  UPDATE Sept 12 2017: She had two migraines in August, 2 headaches again over past 2 weeks, she attributed to weather changes, she took Maxalt prn, sometimes have to use 2 tablets to abort 1 headache,   She is taking Topamax 100 mg every night, has lost 9 pounds over past few months, no significant side effect notice, but morning dose does cause dizziness, sleepiness  She has to use Imitrex injection once, which did help her headache, also make her sleepy. Her 2 teenage her children also suffered typical migraine  REVIEW OF SYSTEMS: Full 14 system review of systems performed and notable only for blurred vision, insomnia, headache  ALLERGIES: No Known Allergies  HOME MEDICATIONS: Current Outpatient Prescriptions  Medication Sig Dispense Refill  . DEXILANT 60 MG capsule Take 60 mg by mouth daily.   3  . magnesium oxide (MAG-OX) 400 MG tablet Take 400 mg by mouth 2 (two) times daily.    Marland Kitchen  ondansetron (ZOFRAN ODT) 4 MG disintegrating tablet Take 1 tablet (4 mg total) by mouth every 8 (eight) hours as needed for nausea or vomiting. 20 tablet 11  . Riboflavin 100 MG CAPS Take by mouth 2 (two) times daily.    . rizatriptan (MAXALT-MLT) 10 MG disintegrating tablet Take 1 tablet (10 mg total) by mouth as needed. May repeat in 2 hours if needed (Patient taking differently: Take 10  mg by mouth as needed for migraine. May repeat in 2 hours if needed) 15 tablet 11  . SUMAtriptan Succinate Refill 6 MG/0.5ML SOCT Inject 0.35ml at onset of migaine.  May repeat in two hours if headache persist or recurs. 5 mL 11  . topiramate (TOPAMAX) 100 MG tablet Take 1 tablet (100 mg total) by mouth 2 (two) times daily. 60 tablet 11   No current facility-administered medications for this visit.     PAST MEDICAL HISTORY: Past Medical History:  Diagnosis Date  . GERD (gastroesophageal reflux disease)   . Migraine headache     PAST SURGICAL HISTORY: Past Surgical History:  Procedure Laterality Date  . ABDOMINAL HYSTERECTOMY    . CHOLECYSTECTOMY      FAMILY HISTORY: Family History  Problem Relation Age of Onset  . Prostate cancer Father   . Hypertension Father   . Diabetes Father   . Hypertension Mother   . Asthma Mother     SOCIAL HISTORY:  Social History   Social History  . Marital status: Married    Spouse name: N/A  . Number of children: 2  . 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 Smoking status: Never Smoker  . Smokeless tobacco: Not on file  . Alcohol use 0.0 oz/week     Comment: 2- 3 times weekly - small amount  . Drug use: No  . Sexual activity: Not on file   Other Topics Concern  . Not on file   Social History Narrative   Lives at home with her husband and two children.   Right-handed.   Drinks small amount of caffeine 2-3 times weekly.     PHYSICAL EXAM   Vitals:   02/09/16 0833  BP: 131/79  Pulse: 79  Weight: 159 lb (72.1 kg)  Height: 5\' 4"  (1.626 m)    Not recorded      Body mass index is 27.29 kg/m.  PHYSICAL EXAMNIATION:  Gen: NAD, conversant, well nourised, obese, well groomed                     Cardiovascular: Regular rate rhythm, no peripheral edema, warm, nontender. Eyes: Conjunctivae clear without exudates or hemorrhage Neck: Supple, no carotid  bruise. Pulmonary: Clear to auscultation bilaterally   NEUROLOGICAL EXAM:  MENTAL STATUS: Speech:    Speech is normal; fluent and spontaneous with normal comprehension.  Cognition:     Orientation to time, place and person     Normal recent and remote memory     Normal Attention span and concentration     Normal Language, naming, repeating,spontaneous speech     Fund of knowledge   CRANIAL NERVES: CN II: Visual fields are full to confrontation. Fundoscopic exam is normal with sharp discs and no vascular changes. Pupils are round equal and briskly reactive to light. CN III, IV, VI: extraocular movement are normal. No ptosis. CN V: Facial sensation is intact to pinprick in all 3 divisions bilaterally. Corneal responses are  intact.  CN VII: Face is symmetric with normal eye closure and smile. CN VIII: Hearing is normal to rubbing fingers CN IX, X: Palate elevates symmetrically. Phonation is normal. CN XI: Head turning and shoulder shrug are intact CN XII: Tongue is midline with normal movements and no atrophy.  MOTOR: There is no pronator drift of out-stretched arms. Muscle bulk and tone are normal. Muscle strength is normal.  REFLEXES: Reflexes are 2+ and symmetric at the biceps, triceps, knees, and ankles. Plantar responses are flexor.  SENSORY: Intact to light touch, pinprick, position sense, and vibration sense are intact in fingers and toes.  COORDINATION: Rapid alternating movements and fine finger movements are intact. There is no dysmetria on finger-to-nose and heel-knee-shin.    GAIT/STANCE: Posture is normal. Gait is steady with normal steps, base, arm swing, and turning. Heel and toe walking are normal. Tandem gait is normal.  Romberg is absent.   DIAGNOSTIC DATA (LABS, IMAGING, TESTING) - I reviewed patient records, labs, notes, testing and imaging myself where available.   ASSESSMENT AND PLAN  Lisa Baxter is a 40 y.o. female     Chronic migraine  I have  suggested preventive medications magnesium oxide 400 mg twice a day, riboflavin 100 mg twice a day  She could not tolerate nortriptyline: Insomnia, weight gain, decreased sex drive,   Topamax she is now taking 100 mg every night, I have suggested her may try higher dose for better result, such as 200 mg every night as preventative medications  Maxalt as needed for moderate headaches, add on Imitrex injection, Zofran and Benadryl as needed for severe headaches  MRI of the brain without contrast in May 2017, mild Arnold-Chiari malformation, normal variant, no compression  Levert FeinsteinYijun Shariq Puig, M.D. Ph.D.  [facility] [address], [address] [place], [place] [address] Ph: [phone] Fax: [phone]  CC: Joycelyn RuaStephen Meyers, MD

## 2016-03-15 MED FILL — TOPIRAMATE 100 MG TABLET: 100 | 30 days supply | Qty: 60 | Fill #3

## 2016-04-05 MED FILL — RIZATRIPTAN 10 MG ODT: 10 | 30 days supply | Qty: 9 | Fill #6

## 2016-04-22 MED FILL — DEXILANT DR 60 MG CAPSULE: 60 | 90 days supply | Qty: 90 | Fill #1

## 2016-04-24 MED FILL — TOPIRAMATE 100 MG TABLET: 100 | 30 days supply | Qty: 60 | Fill #4

## 2016-05-09 ENCOUNTER — Other Ambulatory Visit: Payer: Self-pay | Admitting: Obstetrics and Gynecology

## 2016-05-09 DIAGNOSIS — Z01419 Encounter for gynecological examination (general) (routine) without abnormal findings: Secondary | ICD-10-CM | POA: Diagnosis not present

## 2016-05-09 DIAGNOSIS — Z1231 Encounter for screening mammogram for malignant neoplasm of breast: Secondary | ICD-10-CM

## 2016-05-24 DIAGNOSIS — H52223 Regular astigmatism, bilateral: Secondary | ICD-10-CM | POA: Diagnosis not present

## 2016-05-24 DIAGNOSIS — H5203 Hypermetropia, bilateral: Secondary | ICD-10-CM | POA: Diagnosis not present

## 2016-05-30 DIAGNOSIS — R05 Cough: Secondary | ICD-10-CM | POA: Diagnosis not present

## 2016-05-30 DIAGNOSIS — B349 Viral infection, unspecified: Secondary | ICD-10-CM | POA: Diagnosis not present

## 2016-06-01 ENCOUNTER — Telehealth: Payer: 59 | Admitting: Family

## 2016-06-01 DIAGNOSIS — J209 Acute bronchitis, unspecified: Secondary | ICD-10-CM

## 2016-06-01 MED ORDER — BENZONATATE 100 MG PO CAPS: 100.0000 mg | ORAL_CAPSULE | Freq: Three times a day (TID) | ORAL | 0 refills | Status: DC | PRN

## 2016-06-01 MED ORDER — AZITHROMYCIN 250 MG PO TABS: ORAL_TABLET | ORAL | 0 refills | Status: DC

## 2016-06-01 MED FILL — BENZONATATE 100 MG CAPSULE: 100 | 7 days supply | Qty: 20 | Fill #0

## 2016-06-01 MED FILL — AZITHROMYCIN 250 MG TABLET: 250 | 5 days supply | Qty: 6 | Fill #0

## 2016-06-01 NOTE — Progress Notes (Signed)

## 2016-06-06 ENCOUNTER — Ambulatory Visit
Admission: RE | Admit: 2016-06-06 | Discharge: 2016-06-06 | Disposition: A | Discharge status: Home / Self Care | Payer: 59 | Source: Ambulatory Visit | Attending: Obstetrics and Gynecology | Admitting: Obstetrics and Gynecology

## 2016-06-06 DIAGNOSIS — Z1231 Encounter for screening mammogram for malignant neoplasm of breast: Secondary | ICD-10-CM

## 2016-06-27 MED FILL — RIZATRIPTAN 10 MG ODT: 10 | 30 days supply | Qty: 9 | Fill #0

## 2016-07-01 MED FILL — TOPIRAMATE 100 MG TABLET: 100 | 30 days supply | Qty: 60 | Fill #5

## 2016-08-08 ENCOUNTER — Ambulatory Visit: Payer: 59 | Admitting: Adult Health

## 2016-08-18 ENCOUNTER — Ambulatory Visit (INDEPENDENT_AMBULATORY_CARE_PROVIDER_SITE_OTHER): Payer: 59 | Admitting: Adult Health

## 2016-08-18 ENCOUNTER — Encounter: Payer: Self-pay | Admitting: Adult Health

## 2016-08-18 VITALS — BP 114/78 | HR 79 | Wt 162.8 lb

## 2016-08-18 DIAGNOSIS — G43109 Migraine with aura, not intractable, without status migrainosus: Secondary | ICD-10-CM | POA: Diagnosis not present

## 2016-08-18 MED ORDER — TOPIRAMATE 100 MG PO TABS: 100.0000 mg | ORAL_TABLET | Freq: Every day | ORAL | 11 refills | Status: DC

## 2016-08-18 MED ORDER — SUMATRIPTAN SUCCINATE REFILL 6 MG/0.5ML ~~LOC~~ SOCT: SUBCUTANEOUS | 11 refills | Status: DC

## 2016-08-18 NOTE — Progress Notes (Signed)
PATIENT: Lisa Baxter DOB: [birth year]  REASON FOR VISIT: follow up- migraine HISTORY FROM: patient  HISTORY OF PRESENT ILLNESS: HISTORY per Dr. Zannie CoveYan's notes:  Lisa Baxter is a 41 years old right-handed female, seen in refer by her primary care Dr. Foye DeerSteven Meyer for evaluation of chronic migraine  She reported a history of migraine since 2002, her typical migraine are lateralized severe pounding headache with associated light noise sensitivity, nausea or vomiting, she has migraine 4-5  times each year.  Since 2015, she has increased headaches, couple times each months, trigger for her migraines are weather change, stress, sleep deprivation, hungry, her headache can last for a few hours to 3-5 days,  She tried Imitrex in the past, works well, currently taking Relpax, take few hours to take effect, previously she has tried Topamax as preventive medication, which does help her headaches, but she complains of slow thinking,  UPDATE September 21 2015: She is taking magnesium oxide and riboflavin twice daily, noticed mild improvement of her headache, but still frequent, she could not tolerate nortriptyline, complains of difficulty sleeping, decreased sex drive, weight gain, she has stopped taking nortriptyline now, above-mentioned symptoms has improved.  She has 2 or 3 mild to moderate headache each week, sometimes old exacerbated to a more severe migraine headaches, her mild headache responding well to Maxalt, in September 16 2015, she experienced severe prolonged headaches, presented to emergency room September 17 2015, received Benadryl, Compazine, Decadron, which helped her headache,  UPDATE June 8th 2017: She could not tolerate Topamax 100 mg twice a day, cause confusion, slow thinking, she is now only taking 100 mg every night, Maxalt works for her migraine, but she has to take multiple doses, she has not tried Imitrex yet  We have personally reviewed MRI of the brain without contrast in May  2017, mild Arnold-Chiari malformation, normal variant, no compression.  UPDATE Sept 12 2017: She had two migraines in August, 2 headaches again over past 2 weeks, she attributed to weather changes, she took Maxalt prn, sometimes have to use 2 tablets to abort 1 headache,   She is taking Topamax 100 mg every night, has lost 9 pounds over past few months, no significant side effect notice, but morning dose does cause dizziness, sleepiness  She has to use Imitrex injection once, which did help her headache, also make her sleepy. Her 2 teenage her children also suffered typical migraine  Today 07/29/2016:  Lisa Baxter is a 41 year old female with a history of migraine headaches. She states that her headaches were doing fairly well until the last month. She states that she's had at least one headache a week for the last month. She states sometimes her headaches can last the entire week. She reports they always start above the left eye and extend across the forehead. She does have photophobia, phonophobia, nausea and vomiting. She states that typically she tries to  take Maxalt and if that does not offerany relief she may have to do the injection. In the past she has tried amitriptyline and  beta blocker to treat her migraines. She states that in the past she is also tried taking Topamax twice a day but it did affect her cognition. She returns today for an evaluation.   REVIEW OF SYSTEMS: Out of a complete 14 system review of symptoms, the patient complains only of the following symptoms, and all other reviewed systems are negative.  Insomnia, frequency of urination, headache, cold intolerance  ALLERGIES: No Known  Allergies  HOME MEDICATIONS: Outpatient Medications Prior to Visit  Medication Sig Dispense Refill  . benzonatate (TESSALON PERLES) 100 MG capsule Take 1 capsule (100 mg total) by mouth 3 (three) times daily as needed for cough. 20 capsule 0  . DEXILANT 60 MG capsule Take 60 mg by  mouth daily.   3  . magnesium oxide (MAG-OX) 400 MG tablet Take 400 mg by mouth 2 (two) times daily.    . ondansetron (ZOFRAN ODT) 4 MG disintegrating tablet Take 1 tablet (4 mg total) by mouth every 8 (eight) hours as needed for nausea or vomiting. 20 tablet 11  . Riboflavin 100 MG CAPS Take by mouth 2 (two) times daily.    . rizatriptan (MAXALT-MLT) 10 MG disintegrating tablet Take 1 tablet (10 mg total) by mouth as needed. May repeat in 2 hours if needed 45 tablet 4  . SUMAtriptan Succinate Refill 6 MG/0.5ML SOCT Inject 0.62ml at onset of migaine.  May repeat in two hours if headache persist or recurs. 5 mL 11  . topiramate (TOPAMAX) 100 MG tablet Take 1 tablet (100 mg total) by mouth 2 (two) times daily. 60 tablet 11  . azithromycin (ZITHROMAX) 250 MG tablet Take 500 mg once, then 250 mg for four days 6 tablet 0   No facility-administered medications prior to visit.     PAST MEDICAL HISTORY: Past Medical History:  Diagnosis Date  . GERD (gastroesophageal reflux disease)   . Migraine headache     PAST SURGICAL HISTORY: Past Surgical History:  Procedure Laterality Date  . ABDOMINAL HYSTERECTOMY    . CHOLECYSTECTOMY      FAMILY HISTORY: Family History  Problem Relation Age of Onset  . Prostate cancer Father   . Hypertension Father   . Diabetes Father   . Hypertension Mother   . Asthma Mother     SOCIAL HISTORY: Social History   Social History  . Marital status: Married    Spouse name: N/A  . Number of children: 2  . 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 Smoking status: Never Smoker  . Smokeless tobacco: Never Used  . Alcohol use 0.0 oz/week     Comment: 2- 3 times weekly - small amount  . Drug use: No  . Sexual activity: Not on file   Other Topics Concern  . Not on file   Social History Narrative   Lives at home with her husband and two children.   Right-handed.   Drinks small amount of caffeine  2-3 times weekly.      PHYSICAL EXAM  Vitals:   08/18/16 0858  BP: 114/78  Pulse: 79  Weight: 162 lb 12.8 oz (73.8 kg)   Body mass index is 27.94 kg/m.  Generalized: Well developed, in no acute distress   Neurological examination  Mentation: Alert oriented to time, place, history taking. Follows all commands speech and language fluent Cranial nerve II-XII: Pupils were equal round reactive to light. Extraocular movements were full, visual field were full on confrontational test. Facial sensation and strength were normal. Uvula tongue midline. Head turning and shoulder shrug  were normal and symmetric. Motor: The motor testing reveals 5 over 5 strength of all 4 extremities. Good symmetric motor tone is noted throughout.  Sensory: Sensory testing is intact to soft touch on all 4 extremities. No evidence of extinction is noted.  Coordination: Cerebellar testing reveals good finger-nose-finger and heel-to-shin bilaterally.  Gait  and station: Gait is normal. Tandem gait is normal. Romberg is negative. No drift is seen.  Reflexes: Deep tendon reflexes are symmetric and normal bilaterally.   DIAGNOSTIC DATA (LABS, IMAGING, TESTING) - I reviewed patient records, labs, notes, testing and imaging myself where available.     ASSESSMENT AND PLAN 41 y.o. year old female  has a past medical history of GERD (gastroesophageal reflux disease) and Migraine headache. here with:  1. Migraine headaches  I discussed with the patient about switching Topamax to long-acting as we may be able to increase the long-acting Topamax without her having cognition side effects. However she states that she is not been taking magnesium and riboflavin consistently. She states that she would like to do this first to see if it offers her any benefit with her headaches. I am amenable to this plan. Also mentioned that we could add on gabapentin in the future if we are unable to increase Topamax. I gave the patient a  printout regarding gabapentin. She will follow-up in 3-4 months or sooner if needed.     Butch Penny, MSN, NP-C 08/18/2016, 8:59 AM [facility] [address] [place], [place] [address] [phone]

## 2016-08-18 NOTE — Patient Instructions (Signed)
Continue Topamax Try to be consistent with Magnesium and Riboflavin  Take maxalt as soon as the headache starts.  In the future we can switch to topiratmate ER or gabapentin  Gabapentin capsules or tablets What is this medicine? GABAPENTIN (GA ba pen tin) is used to control partial seizures in adults with epilepsy. It is also used to treat certain types of nerve pain. This medicine may be used for other purposes; ask your health care provider or pharmacist if you have questions. COMMON BRAND NAME(S): Active-PAC with Gabapentin, Gabarone, Neurontin What should I tell my health care provider before I take this medicine? They need to know if you have any of these conditions: -kidney disease -suicidal thoughts, plans, or attempt; a previous suicide attempt by you or a family member -an unusual or allergic reaction to gabapentin, other medicines, foods, dyes, or preservatives -pregnant or trying to get pregnant -breast-feeding How should I use this medicine? Take this medicine by mouth with a glass of water. Follow the directions on the prescription label. You can take it with or without food. If it upsets your stomach, take it with food.Take your medicine at regular intervals. Do not take it more often than directed. Do not stop taking except on your doctor's advice. If you are directed to break the 600 or 800 mg tablets in half as part of your dose, the extra half tablet should be used for the next dose. If you have not used the extra half tablet within 28 days, it should be thrown away. A special MedGuide will be given to you by the pharmacist with each prescription and refill. Be sure to read this information carefully each time. Talk to your pediatrician regarding the use of this medicine in children. Special care may be needed. Overdosage: If you think you have taken too much of this medicine contact a poison control center or emergency room at once. NOTE: This medicine is only for you. Do  not share this medicine with others. What if I miss a dose? If you miss a dose, take it as soon as you can. If it is almost time for your next dose, take only that dose. Do not take double or extra doses. What may interact with this medicine? Do not take this medicine with any of the following medications: -other gabapentin products This medicine may also interact with the following medications: -alcohol -antacids -antihistamines for allergy, cough and cold -certain medicines for anxiety or sleep -certain medicines for depression or psychotic disturbances -homatropine; hydrocodone -naproxen -narcotic medicines (opiates) for pain -phenothiazines like chlorpromazine, mesoridazine, prochlorperazine, thioridazine This list may not describe all possible interactions. Give your health care provider a list of all the medicines, herbs, non-prescription drugs, or dietary supplements you use. Also tell them if you smoke, drink alcohol, or use illegal drugs. Some items may interact with your medicine. What should I watch for while using this medicine? Visit your doctor or health care professional for regular checks on your progress. You may want to keep a record at home of how you feel your condition is responding to treatment. You may want to share this information with your doctor or health care professional at each visit. You should contact your doctor or health care professional if your seizures get worse or if you have any new types of seizures. Do not stop taking this medicine or any of your seizure medicines unless instructed by your doctor or health care professional. Stopping your medicine suddenly can increase your seizures  or their severity. Wear a medical identification bracelet or chain if you are taking this medicine for seizures, and carry a card that lists all your medications. You may get drowsy, dizzy, or have blurred vision. Do not drive, use machinery, or do anything that needs mental  alertness until you know how this medicine affects you. To reduce dizzy or fainting spells, do not sit or stand up quickly, especially if you are an older patient. Alcohol can increase drowsiness and dizziness. Avoid alcoholic drinks. Your mouth may get dry. Chewing sugarless gum or sucking hard candy, and drinking plenty of water will help. The use of this medicine may increase the chance of suicidal thoughts or actions. Pay special attention to how you are responding while on this medicine. Any worsening of mood, or thoughts of suicide or dying should be reported to your health care professional right away. Women who become pregnant while using this medicine may enroll in the [place] American Antiepileptic Drug Pregnancy Registry by calling [phone]. This registry collects information about the safety of antiepileptic drug use during pregnancy. What side effects may I notice from receiving this medicine? Side effects that you should report to your doctor or health care professional as soon as possible: -allergic reactions like skin rash, itching or hives, swelling of the face, lips, or tongue -worsening of mood, thoughts or actions of suicide or dying Side effects that usually do not require medical attention (report to your doctor or health care professional if they continue or are bothersome): -constipation -difficulty walking or controlling muscle movements -dizziness -nausea -slurred speech -tiredness -tremors -weight gain This list may not describe all possible side effects. Call your doctor for medical advice about side effects. You may report side effects to FDA at 1-800-FDA-1088. Where should I keep my medicine? Keep out of reach of children. This medicine may cause accidental overdose and death if it taken by other adults, children, or pets. Mix any unused medicine with a substance like cat litter or coffee grounds. Then throw the medicine away in a sealed container like a sealed  bag or a coffee can with a lid. Do not use the medicine after the expiration date. Store at room temperature between 15 and 30 degrees C (59 and 86 degrees F). NOTE: This sheet is a summary. It may not cover all possible information. If you have questions about this medicine, talk to your doctor, pharmacist, or health care provider.  2018 Elsevier/Gold Standard (2013-07-12 15:26:50)

## 2016-08-18 NOTE — Progress Notes (Signed)
I have reviewed and agreed above plan. 

## 2016-09-12 MED FILL — PANTOPRAZOLE SOD DR 40 MG T: 40 | 30 days supply | Qty: 30 | Fill #0

## 2016-09-16 MED FILL — TOPIRAMATE 100 MG TABLET: 100 | 30 days supply | Qty: 60 | Fill #6

## 2016-09-27 MED FILL — RIZATRIPTAN 10 MG ODT: 10 | 30 days supply | Qty: 9 | Fill #1

## 2016-10-21 MED FILL — RIZATRIPTAN 10 MG ODT: 10 | 30 days supply | Qty: 9 | Fill #2

## 2016-10-25 MED FILL — PANTOPRAZOLE SOD DR 40 MG T: 40 | 30 days supply | Qty: 30 | Fill #1

## 2016-11-25 MED FILL — PANTOPRAZOLE SOD DR 40 MG T: 40 | 30 days supply | Qty: 30 | Fill #2

## 2016-12-12 MED FILL — SUMATRIPTAN 6 MG/0.5 ML REF: 6 | 28 days supply | Qty: 2 | Fill #0

## 2016-12-28 MED FILL — PANTOPRAZOLE SOD DR 40 MG T: 40 | 30 days supply | Qty: 30 | Fill #3

## 2017-01-04 ENCOUNTER — Encounter: Payer: Self-pay | Admitting: Adult Health

## 2017-01-04 ENCOUNTER — Ambulatory Visit (INDEPENDENT_AMBULATORY_CARE_PROVIDER_SITE_OTHER): Payer: 59 | Admitting: Adult Health

## 2017-01-04 VITALS — BP 122/72 | HR 66 | Ht 64.0 in | Wt 173.2 lb

## 2017-01-04 DIAGNOSIS — G43019 Migraine without aura, intractable, without status migrainosus: Secondary | ICD-10-CM

## 2017-01-04 MED ORDER — GABAPENTIN 100 MG PO CAPS: ORAL_CAPSULE | ORAL | 5 refills | Status: DC

## 2017-01-04 MED FILL — GABAPENTIN 100 MG CAPS: 100 | 30 days supply | Qty: 90 | Fill #0

## 2017-01-04 NOTE — Patient Instructions (Signed)
Continue Topamax- once on gabapentin we will slowly try to wean off Start Gabapentin: Week 1: 1 Capsule at bedtime Week 2: 2 Capsules at bedtime Week 3: 3 Capsules at bedtime  Continue gabapentin 3 capsules at bedtime  If your symptoms worsen or you develop new symptoms please let us know.

## 2017-01-04 NOTE — Progress Notes (Signed)
PATIENT: Lisa Baxter DOB: 02-Feb-1976  REASON FOR VISIT: follow up- migraine HISTORY FROM: patient  HISTORY OF PRESENT ILLNESS: HISTORY per Dr. Zannie Cove notes:  Lisa Baxter a 41 years old right-handed female, seen in refer by her primary care Dr. Foye Deer for evaluation of chronic migraine  She reported a history of migraine since 2002, her typical migraine are lateralized severe pounding headache with associated light noise sensitivity, nausea or vomiting, she has migraine 4-5 times each year. Since 2015, she has increased headaches, couple times each months, trigger for her migraines are weather change, stress, sleep deprivation, hungry, her headache can last for a few hours to 3-5 days,  She tried Imitrex in the past, works well, currently taking Relpax, take few hours to take effect, previously she has tried Topamax as preventive medication, which does help her headaches, but she complains of slow thinking,  UPDATE September 21 2015: She is taking magnesium oxide and riboflavin twice daily, noticed mild improvement of her headache, but still frequent, she could not tolerate nortriptyline, complains of difficulty sleeping, decreased sex drive, weight gain, she has stopped taking nortriptyline now, above-mentioned symptoms has improved.  She has 2 or 3 mild to moderate headache each week, sometimes old exacerbated to a more severe migraine headaches, her mild headache responding well to Maxalt, in September 16 2015, she experienced severe prolonged headaches, presented to emergency room September 17 2015, received Benadryl, Compazine, Decadron, which helped her headache,  UPDATE June 8th 2017: She could not tolerate Topamax 100 mg twice a day, cause confusion, slow thinking, she is now only taking 100 mg every night, Maxalt works for her migraine, but she has to take multiple doses, she has not tried Imitrex yet  We have personally reviewed MRI of the brain without contrast in May  2017, mild Arnold-Chiari malformation, normal variant, no compression.  UPDATE Sept 12 2017: She had two migraines in August, 2 headaches again over past 2 weeks,she attributed to weather changes,she took Maxalt prn, sometimes have to use 2 tablets to abort 1 headache,   She is taking Topamax 100 mg every night, has lost 9 pounds over past few months, no significant side effect notice, but morning dose does cause dizziness, sleepiness  She has to use Imitrex injection once, which did help her headache, also make her sleepy. Her 2 teenage her children also suffered typical migraine   07/29/2016:  Lisa Baxter is a 41 year old female with a history of migraine headaches. She states that her headaches were doing fairly well until the last month. She states that she's had at least one headache a week for the last month. She states sometimes her headaches can last the entire week. She reports they always start above the left eye and extend across the forehead. She does have photophobia, phonophobia, nausea and vomiting. She states that typically she tries to  take Maxalt and if that does not offerany relief she may have to do the injection. In the past she has tried amitriptyline and  beta blocker to treat her migraines. She states that in the past she is also tried taking Topamax twice a day but it did affect her cognition. She returns today for an evaluation.  01/04/17:  Lisa Baxter is a 41 year old female with a history of migraine headaches. She returns today for follow-up. She is currently on Topamax 100 mg at bedtime as well as magnesium and riboflavin. She states that she does not take magnesium and riboflavin consistently.  She states that since March she has had approximately 4-5 migraine headaches. She states that she gets a "regular headache" every 10 days that can last up to 2 days. Her headache usually occurs across the forehead. She does have photophobia, phonophobia, nausea but no vomiting.  She states occasionally with her migraines she may experience some slight weakness in the arms and legs. In the past she has been unable to tolerate higher doses of Topamax due to cognitive side effects. She returns today for an evaluation.   REVIEW OF SYSTEMS: Out of a complete 14 system review of symptoms, the patient complains only of the following symptoms, and all other reviewed systems are negative.  Unexpected weight change, constipation, insomnia, frequent waking, decreased concentration, headache, anemia, bruise/bleed  ALLERGIES: No Known Allergies  HOME MEDICATIONS: Outpatient Medications Prior to Visit  Medication Sig Dispense Refill  . magnesium oxide (MAG-OX) 400 MG tablet Take 400 mg by mouth 2 (two) times daily.    . ondansetron (ZOFRAN ODT) 4 MG disintegrating tablet Take 1 tablet (4 mg total) by mouth every 8 (eight) hours as needed for nausea or vomiting. 20 tablet 11  . Riboflavin 100 MG CAPS Take by mouth 2 (two) times daily.    . rizatriptan (MAXALT-MLT) 10 MG disintegrating tablet Take 1 tablet (10 mg total) by mouth as needed. May repeat in 2 hours if needed 45 tablet 4  . SUMAtriptan Succinate Refill 6 MG/0.5ML SOCT Inject 0.54ml at onset of migaine.  May repeat in two hours if headache persist or recurs. 5 mL 11  . topiramate (TOPAMAX) 100 MG tablet Take 1 tablet (100 mg total) by mouth at bedtime. 30 tablet 11  . benzonatate (TESSALON PERLES) 100 MG capsule Take 1 capsule (100 mg total) by mouth 3 (three) times daily as needed for cough. 20 capsule 0  . DEXILANT 60 MG capsule Take 60 mg by mouth daily.   3   No facility-administered medications prior to visit.     PAST MEDICAL HISTORY: Past Medical History:  Diagnosis Date  . GERD (gastroesophageal reflux disease)   . Migraine headache     PAST SURGICAL HISTORY: Past Surgical History:  Procedure Laterality Date  . ABDOMINAL HYSTERECTOMY    . CHOLECYSTECTOMY      FAMILY HISTORY: Family History  Problem  Relation Age of Onset  . Prostate cancer Father   . Hypertension Father   . Diabetes Father   . Hypertension Mother   . Asthma Mother     SOCIAL HISTORY: Social History   Social History  . Marital status: Married    Spouse name: N/A  . Number of children: 2  . 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 Smoking status: Never Smoker  . Smokeless tobacco: Never Used  . Alcohol use 0.0 oz/week     Comment: 2- 3 times weekly - small amount  . Drug use: No  . Sexual activity: Not on file   Other Topics Concern  . Not on file   Social History Narrative   Lives at home with her husband and two children.   Right-handed.   Drinks small amount of caffeine 2-3 times weekly.      PHYSICAL EXAM  Vitals:   01/04/17 0825  BP: 122/72  Pulse: 66  Weight: 173 lb 3.2 oz (78.6 kg)  Height: 5\' 4"  (1.626 m)   Body mass index is 29.73 kg/m.  Generalized:  Well developed, in no acute distress   Neurological examination  Mentation: Alert oriented to time, place, history taking. Follows all commands speech and language fluent Cranial nerve II-XII: Pupils were equal round reactive to light. Extraocular movements were full, visual field were full on confrontational test. Facial sensation and strength were normal. Uvula tongue midline. Head turning and shoulder shrug  were normal and symmetric. Motor: The motor testing reveals 5 over 5 strength of all 4 extremities. Good symmetric motor tone is noted throughout.  Sensory: Sensory testing is intact to soft touch on all 4 extremities. No evidence of extinction is noted.  Coordination: Cerebellar testing reveals good finger-nose-finger and heel-to-shin bilaterally.  Gait and station: Gait is normal. Tandem gait is normal. Romberg is negative. No drift is seen.  Reflexes: Deep tendon reflexes are symmetric and normal bilaterally.   DIAGNOSTIC DATA (LABS, IMAGING, TESTING) - I reviewed  patient records, labs, notes, testing and imaging myself where available.    ASSESSMENT AND PLAN 41 y.o. year old female  has a past medical history of GERD (gastroesophageal reflux disease) and Migraine headache. here with:  1. Migraine headache  The patient is having several headaches each month. She is unable to increase Topamax due to cognitive side effects. We will add on gabapentin taking 100 mg at bedtime for the first week then increasing to 200 mg second week and then 300 mg thereafter. If gabapentin is beneficial for her headaches we will slowly wean her off of Topamax. I have reviewed the side effects of gabapentin with the patient. I have advised that if her symptoms worsen or she develops new symptoms she should let us know. She will follow-up in 6 months or sooner if needed.  I spent 15 minutes with the patient. 50% of this time was spent discussing Medication gabapentin     Butch PennyMegan Bobie Caris, MSN, NP-C 01/04/2017, 8:35 AM [facility] [address], [address] [place], [place] [address] [phone]

## 2017-01-12 NOTE — Progress Notes (Signed)
I have reviewed and agreed above plan. 

## 2017-01-31 MED FILL — PANTOPRAZOLE SOD DR 40 MG T: 40 | 30 days supply | Qty: 30 | Fill #0

## 2017-02-17 ENCOUNTER — Other Ambulatory Visit: Payer: Self-pay | Admitting: Neurology

## 2017-02-20 MED FILL — RIZATRIPTAN 10 MG ODT: 10 | 30 days supply | Qty: 18 | Fill #0

## 2017-02-20 MED FILL — SUMATRIPTAN 6 MG/0.5 ML REF: 6 | 28 days supply | Qty: 2 | Fill #1

## 2017-03-06 MED FILL — PANTOPRAZOLE SOD DR 40 MG T: 40 | 30 days supply | Qty: 30 | Fill #1

## 2017-04-05 MED FILL — PANTOPRAZOLE SOD DR 40 MG T: 40 | 30 days supply | Qty: 30 | Fill #2

## 2017-04-13 MED FILL — GABAPENTIN 100 MG CAPS: 100 | 30 days supply | Qty: 90 | Fill #1

## 2017-05-05 MED FILL — PANTOPRAZOLE SOD DR 40 MG T: 40 | 30 days supply | Qty: 30 | Fill #0

## 2017-05-08 ENCOUNTER — Ambulatory Visit: Payer: 59 | Admitting: Adult Health

## 2017-05-25 DIAGNOSIS — H52223 Regular astigmatism, bilateral: Secondary | ICD-10-CM | POA: Diagnosis not present

## 2017-05-25 DIAGNOSIS — H5203 Hypermetropia, bilateral: Secondary | ICD-10-CM | POA: Diagnosis not present

## 2017-06-01 ENCOUNTER — Ambulatory Visit (INDEPENDENT_AMBULATORY_CARE_PROVIDER_SITE_OTHER): Payer: Self-pay | Admitting: Emergency Medicine

## 2017-06-01 ENCOUNTER — Encounter: Payer: Self-pay | Admitting: Emergency Medicine

## 2017-06-01 VITALS — BP 112/84 | HR 66 | Temp 98.4°F | Resp 16

## 2017-06-01 DIAGNOSIS — R110 Nausea: Secondary | ICD-10-CM

## 2017-06-01 DIAGNOSIS — K5900 Constipation, unspecified: Secondary | ICD-10-CM

## 2017-06-01 DIAGNOSIS — R319 Hematuria, unspecified: Secondary | ICD-10-CM

## 2017-06-01 DIAGNOSIS — R350 Frequency of micturition: Secondary | ICD-10-CM

## 2017-06-01 LAB — POCT URINALYSIS DIPSTICK
Bilirubin, UA: NEGATIVE
Glucose, UA: NEGATIVE
Ketones, UA: NEGATIVE
Leukocytes, UA: NEGATIVE
Nitrite, UA: NEGATIVE
Protein, UA: NEGATIVE
Spec Grav, UA: 1.01
Urobilinogen, UA: 0.2 U/dL
pH, UA: 6

## 2017-06-01 MED ORDER — KETOROLAC TROMETHAMINE 10 MG PO TABS: 10.0000 mg | ORAL_TABLET | Freq: Four times a day (QID) | ORAL | 0 refills | Status: DC | PRN

## 2017-06-01 MED ORDER — SULFAMETHOXAZOLE-TRIMETHOPRIM 800-160 MG PO TABS: 1.0000 | ORAL_TABLET | Freq: Two times a day (BID) | ORAL | 0 refills | Status: DC

## 2017-06-01 MED ORDER — ONDANSETRON 4 MG PO TBDP: 4.0000 mg | ORAL_TABLET | Freq: Three times a day (TID) | ORAL | 11 refills | Status: DC | PRN

## 2017-06-01 MED ORDER — ONDANSETRON HCL 4 MG PO TABS: 4.0000 mg | ORAL_TABLET | Freq: Three times a day (TID) | ORAL | 0 refills | Status: DC | PRN

## 2017-06-01 MED ORDER — POLYETHYLENE GLYCOL 3350 17 GM/SCOOP PO POWD: 17.0000 g | Freq: Two times a day (BID) | ORAL | 1 refills | Status: AC | PRN

## 2017-06-01 MED ORDER — POLYETHYLENE GLYCOL 3350 17 GM/SCOOP PO POWD: 17.0000 g | Freq: Two times a day (BID) | ORAL | 1 refills | Status: DC | PRN

## 2017-06-01 MED FILL — POLYETHYLENE GLYCOL 3350 PO: 77 days supply | Qty: 2635 | Fill #0

## 2017-06-01 MED FILL — SULFAMETHOXAZOLE-TMP DS TAB: 800-160 | 7 days supply | Qty: 14 | Fill #0

## 2017-06-01 MED FILL — ONDANSETRON HCL 4 MG TABLET: 4 | 10 days supply | Qty: 30 | Fill #0

## 2017-06-01 MED FILL — KETOROLAC 10 MG TABLET: 10 | 5 days supply | Qty: 20 | Fill #0

## 2017-06-01 NOTE — Patient Instructions (Signed)
Constipation, Adult Constipation is when a person:  Poops (has a bowel movement) fewer times in a week than normal.  Has a hard time pooping.  Has poop that is dry, hard, or bigger than normal.  Follow these instructions at home: Eating and drinking   Eat foods that have a lot of fiber, such as: ? Fresh fruits and vegetables. ? Whole grains. ? Beans.  Eat less of foods that are high in fat, low in fiber, or overly processed, such as: ? Jamaica[place] fries. ? Hamburgers. ? Cookies. ? Candy. ? Soda.  Drink enough fluid to keep your pee (urine) clear or pale yellow. General instructions  Exercise regularly or as told by your doctor.  Go to the restroom when you feel like you need to poop. Do not hold it in.  Take over-the-counter and prescription medicines only as told by your doctor. These include any fiber supplements.  Do pelvic floor retraining exercises, such as: ? Doing deep breathing while relaxing your lower belly (abdomen). ? Relaxing your pelvic floor while pooping.  Watch your condition for any changes.  Keep all follow-up visits as told by your doctor. This is important. Contact a doctor if:  You have pain that gets worse.  You have a fever.  You have not pooped for 4 days.  You throw up (vomit).  You are not hungry.  You lose weight.  You are bleeding from the anus.  You have thin, pencil-like poop (stool). Get help right away if:  You have a fever, and your symptoms suddenly get worse.  You leak poop or have blood in your poop.  Your belly feels hard or bigger than normal (is bloated).  You have very bad belly pain.  You feel dizzy or you faint. This information is not intended to replace advice given to you by your health care provider. Make sure you discuss any questions you have with your health care provider. Document Released: 11/02/2007 Document Revised: 12/04/2015 Document Reviewed: 11/04/2015 Elsevier Interactive Patient Education   2018 [employer] Hematuria, Adult Hematuria is blood in your urine. It can be caused by a bladder infection, kidney infection, prostate infection, kidney stone, or cancer of your urinary tract. Infections can usually be treated with medicine, and a kidney stone usually will pass through your urine. If neither of these is the cause of your hematuria, further workup to find out the reason may be needed. It is very important that you tell your health care provider about any blood you see in your urine, even if the blood stops without treatment or happens without causing pain. Blood in your urine that happens and then stops and then happens again can be a symptom of a very serious condition. Also, pain is not a symptom in the initial stages of many urinary cancers. Follow these instructions at home:  Drink lots of fluid, 3-4 quarts a day. If you have been diagnosed with an infection, cranberry juice is especially recommended, in addition to large amounts of water.  Avoid caffeine, tea, and carbonated beverages because they tend to irritate the bladder.  Avoid alcohol because it may irritate the prostate.  Take all medicines as directed by your health care provider.  If you were prescribed an antibiotic medicine, finish it all even if you start to feel better.  If you have been diagnosed with a kidney stone, follow your health care provider's instructions regarding straining your urine to catch the stone.  Empty your bladder often. Avoid holding  urine for long periods of time.  After a bowel movement, women should cleanse front to back. Use each tissue only once.  Empty your bladder before and after sexual intercourse if you are a female. Contact a health care provider if:  You develop back pain.  You have a fever.  You have a feeling of sickness in your stomach (nausea) or vomiting.  Your symptoms are not better in 3 days. Return sooner if you are getting worse. Get help right away  if:  You develop severe vomiting and are unable to keep the medicine down.  You develop severe back or abdominal pain despite taking your medicines.  You begin passing a large amount of blood or clots in your urine.  You feel extremely weak or faint, or you pass out. This information is not intended to replace advice given to you by your health care provider. Make sure you discuss any questions you have with your health care provider. Document Released: 05/16/2005 Document Revised: 10/22/2015 Document Reviewed: 01/14/2013 Elsevier Interactive Patient Education  2017 [employer].

## 2017-06-01 NOTE — Progress Notes (Signed)
Subjective:     Lisa Baxter is a 42 y.o. female who presents for evaluation of abdominal pain and right flank pain. The pain is described as aching, colicky and cramping, and is 2/10 in intensity. Pain is located in the diffusely, CVA right without radiation. Onset was 2 days ago. Symptoms have been unchanged since. Aggravating factors: movement.  Alleviating factors: none. Associated symptoms: anorexia, belching, constipation, flatus, nausea and vomiting. The patient denies diarrhea, dysuria, fever and myalgias. The following portions of the patient's history were reviewed and updated as appropriate: allergies and current medications.  Review of Systems Pertinent items are noted in HPI.    Objective:    BP 112/84 (BP Location: Right Arm, Patient Position: Sitting, Cuff Size: Normal)   Pulse 66   Temp 98.4 F (36.9 C) (Oral)   Resp 16   SpO2 98%  General appearance: alert, cooperative and appears stated age Lungs: clear to auscultation bilaterally Heart: regular rate and rhythm, S1, S2 normal, no murmur, click, rub or gallop Abdomen: normal findings: bowel sounds normal and soft and abnormal findings:  moderate tenderness in the RUQ and in the right flank Extremities: extremities normal, atraumatic, no cyanosis or edema Pulses: 2+ and symmetric    Results for orders placed or performed in visit on 06/01/17  POCT Urinalysis Dipstick  Result Value Ref Range   Color, UA yellow    Clarity, UA clear    Glucose, UA neg    Bilirubin, UA neg    Ketones, UA neg    Spec Grav, UA 1.010 1.010 - 1.025   Blood, UA small    pH, UA 6.0 5.0 - 8.0   Protein, UA neg    Urobilinogen, UA 0.2 0.2 or 1.0 E.U./dL   Nitrite, UA neg    Leukocytes, UA Negative Negative   Appearance     Odor      Assessment:   1. Frequent urination   2. Hematuria, unspecified type   3. Constipation, unspecified constipation type    Plan:   42 year old female presents for right sided flank pain, N/V,  constipation, and bloating, findings of hematuria on urinalysis. Differential diagnosis is broad to include gastroenteritis, renal colic, kidney stones, and pyelonephritis. Will treat for symptoms, recommended strict follow up guidelines to seek treatment at the ER if symptoms persist past 5 days, or anytime if they worsen. If symptoms improve, follow up with PCP in 2 weeks for reevaluation of hematuria.     1. Frequent urination  - POCT Urinalysis Dipstick - ondansetron (ZOFRAN) 4 MG tablet; Take 1 tablet (4 mg total) by mouth every 8 (eight) hours as needed for nausea or vomiting.  Dispense: 30 tablet; Refill: 0  2. Hematuria, unspecified type  - ketorolac (TORADOL) 10 MG tablet; Take 1 tablet (10 mg total) by mouth every 6 (six) hours as needed.  Dispense: 20 tablet; Refill: 0 - sulfamethoxazole-trimethoprim (BACTRIM DS) 800-160 MG tablet; Take 1 tablet by mouth 2 (two) times daily.  Dispense: 14 tablet; Refill: 0  3. Constipation, unspecified constipation type  - ondansetron (ZOFRAN) 4 MG tablet; Take 1 tablet (4 mg total) by mouth every 8 (eight) hours as needed for nausea or vomiting.  Dispense: 30 tablet; Refill: 0 - polyethylene glycol powder (GLYCOLAX/MIRALAX) powder; Take 17 g by mouth 2 (two) times daily as needed.  Dispense: 3350 g; Refill: 1  4. Nausea  ondansetron (ZOFRAN) 4 MG tablet; Take 1 tablet (4 mg total) by mouth every 8 (eight) hours  as needed for nausea or vomiting.  Dispense: 30 tablet; Refill: 0

## 2017-06-06 ENCOUNTER — Other Ambulatory Visit: Payer: Self-pay | Admitting: Obstetrics and Gynecology

## 2017-06-06 DIAGNOSIS — K5901 Slow transit constipation: Secondary | ICD-10-CM | POA: Diagnosis not present

## 2017-06-06 DIAGNOSIS — Z01411 Encounter for gynecological examination (general) (routine) with abnormal findings: Secondary | ICD-10-CM | POA: Diagnosis not present

## 2017-06-06 DIAGNOSIS — Z1231 Encounter for screening mammogram for malignant neoplasm of breast: Secondary | ICD-10-CM

## 2017-06-06 DIAGNOSIS — R102 Pelvic and perineal pain: Secondary | ICD-10-CM | POA: Diagnosis not present

## 2017-06-07 DIAGNOSIS — R102 Pelvic and perineal pain: Secondary | ICD-10-CM | POA: Diagnosis not present

## 2017-06-09 ENCOUNTER — Ambulatory Visit: Payer: 59

## 2017-06-20 ENCOUNTER — Encounter: Payer: Self-pay | Admitting: Internal Medicine

## 2017-06-20 DIAGNOSIS — K219 Gastro-esophageal reflux disease without esophagitis: Secondary | ICD-10-CM | POA: Diagnosis not present

## 2017-06-20 DIAGNOSIS — R197 Diarrhea, unspecified: Secondary | ICD-10-CM | POA: Diagnosis not present

## 2017-06-20 DIAGNOSIS — K580 Irritable bowel syndrome with diarrhea: Secondary | ICD-10-CM | POA: Diagnosis not present

## 2017-06-20 DIAGNOSIS — R140 Abdominal distension (gaseous): Secondary | ICD-10-CM | POA: Diagnosis not present

## 2017-06-20 MED FILL — DICYCLOMINE 20 MG TABLET: 20 | 30 days supply | Qty: 120 | Fill #0

## 2017-06-22 DIAGNOSIS — R3121 Asymptomatic microscopic hematuria: Secondary | ICD-10-CM | POA: Diagnosis not present

## 2017-06-26 MED FILL — DEXILANT DR 60 MG CAPSULE: 60 | 30 days supply | Qty: 30 | Fill #0

## 2017-06-28 DIAGNOSIS — R3129 Other microscopic hematuria: Secondary | ICD-10-CM | POA: Diagnosis not present

## 2017-06-28 DIAGNOSIS — R3121 Asymptomatic microscopic hematuria: Secondary | ICD-10-CM | POA: Diagnosis not present

## 2017-07-03 ENCOUNTER — Ambulatory Visit
Admission: RE | Admit: 2017-07-03 | Discharge: 2017-07-03 | Disposition: A | Discharge status: Home / Self Care | Payer: 59 | Source: Ambulatory Visit | Attending: Obstetrics and Gynecology | Admitting: Obstetrics and Gynecology

## 2017-07-03 DIAGNOSIS — Z1231 Encounter for screening mammogram for malignant neoplasm of breast: Secondary | ICD-10-CM | POA: Diagnosis not present

## 2017-07-05 DIAGNOSIS — R3121 Asymptomatic microscopic hematuria: Secondary | ICD-10-CM | POA: Diagnosis not present

## 2017-08-09 ENCOUNTER — Telehealth: Payer: 59 | Admitting: Nurse Practitioner

## 2017-08-09 DIAGNOSIS — J0100 Acute maxillary sinusitis, unspecified: Secondary | ICD-10-CM | POA: Diagnosis not present

## 2017-08-09 DIAGNOSIS — R059 Cough, unspecified: Secondary | ICD-10-CM

## 2017-08-09 DIAGNOSIS — R05 Cough: Secondary | ICD-10-CM

## 2017-08-09 MED ORDER — AZITHROMYCIN 250 MG PO TABS: ORAL_TABLET | ORAL | 0 refills | Status: DC

## 2017-08-09 MED ORDER — FLUTICASONE PROPIONATE 50 MCG/ACT NA SUSP: 2.0000 | Freq: Every day | NASAL | 6 refills | Status: DC

## 2017-08-09 MED ORDER — BENZONATATE 100 MG PO CAPS: 100.0000 mg | ORAL_CAPSULE | Freq: Three times a day (TID) | ORAL | 0 refills | Status: DC | PRN

## 2017-08-09 MED FILL — AZITHROMYCIN 250 MG TABS: 250 | 5 days supply | Qty: 6 | Fill #0

## 2017-08-09 MED FILL — FLUTICASONE PROP 50 MCG SPR: 50 | 30 days supply | Qty: 16 | Fill #0

## 2017-08-09 MED FILL — BENZONATATE 100 MG CAP: 100 | 7 days supply | Qty: 20 | Fill #0

## 2017-08-09 NOTE — Progress Notes (Signed)

## 2017-08-17 MED FILL — PANTOPRAZOLE SOD DR 40 MG T: 40 | 30 days supply | Qty: 30 | Fill #0

## 2017-08-29 ENCOUNTER — Ambulatory Visit: Payer: Self-pay | Admitting: Adult Health

## 2017-08-30 ENCOUNTER — Telehealth: Payer: Self-pay | Admitting: Adult Health

## 2017-08-30 DIAGNOSIS — G43019 Migraine without aura, intractable, without status migrainosus: Secondary | ICD-10-CM | POA: Diagnosis not present

## 2017-08-30 NOTE — Telephone Encounter (Signed)
I called the patient.  She has had a migraine since Sunday.  She is tried taking Maxalt and Imitrex injection with little benefit.  She states in the past when she went to the ED there migraine cocktail offered her good improvement of the headache.  She will come in today for a Depakote infusion.  We will start with 500 mg IV and can repeat once if needed.

## 2017-08-30 NOTE — Telephone Encounter (Signed)
Patient has had a migraine for a few days and would like to come in for an injection. Please call and advise.

## 2017-08-30 NOTE — Telephone Encounter (Signed)
Orders given to [facility] for Depacon 500mg  IV x 1 and repeat if needed.

## 2017-08-30 NOTE — Telephone Encounter (Signed)
Spoke to pt and she has had migraine since Sunday night.  She has taken maxalt (Sunday thru Tuesday) and imitrex injections Monday and Tuesday).  She is having nausea and vomiting.  Pain at Level 8 with pressure.  She has been to ED for migraine cocktail, but has not been here for infusion.  I did check with [facility] and they are able to get her at 1400 today.  Please advise. (Dr. Terrace ArabiaYan is your supervising MD).  Pt may have a driver.

## 2017-08-31 ENCOUNTER — Other Ambulatory Visit: Payer: Self-pay | Admitting: Adult Health

## 2017-08-31 MED FILL — SUMATRIPTAN 6 MG/0.5 ML REF: 6 | 28 days supply | Qty: 2 | Fill #0 | Status: TO

## 2017-08-31 MED FILL — RIZATRIPTAN 10 MG ODT: 10 | 30 days supply | Qty: 18 | Fill #1

## 2017-09-26 MED FILL — PANTOPRAZOLE SOD DR 40 MG T: 40 | 30 days supply | Qty: 30 | Fill #1

## 2017-10-30 MED FILL — PANTOPRAZOLE SOD DR 40 MG T: 40 | 90 days supply | Qty: 90 | Fill #2

## 2017-11-08 ENCOUNTER — Ambulatory Visit: Payer: 59 | Admitting: Family Medicine

## 2017-11-24 DIAGNOSIS — Z0000 Encounter for general adult medical examination without abnormal findings: Secondary | ICD-10-CM | POA: Diagnosis not present

## 2017-11-24 DIAGNOSIS — Z131 Encounter for screening for diabetes mellitus: Secondary | ICD-10-CM | POA: Diagnosis not present

## 2017-11-24 DIAGNOSIS — R05 Cough: Secondary | ICD-10-CM | POA: Diagnosis not present

## 2017-11-24 DIAGNOSIS — K219 Gastro-esophageal reflux disease without esophagitis: Secondary | ICD-10-CM | POA: Diagnosis not present

## 2017-11-24 DIAGNOSIS — G43909 Migraine, unspecified, not intractable, without status migrainosus: Secondary | ICD-10-CM | POA: Diagnosis not present

## 2017-11-24 DIAGNOSIS — Z13220 Encounter for screening for lipoid disorders: Secondary | ICD-10-CM | POA: Diagnosis not present

## 2017-11-24 MED FILL — PROMETHAZINE-DM SYRUP: 6.25-15 | 10 days supply | Qty: 200 | Fill #0

## 2017-12-05 ENCOUNTER — Ambulatory Visit: Payer: Self-pay | Admitting: Adult Health

## 2017-12-15 MED FILL — RIZATRIPTAN 10 MG ODT: 10 | 30 days supply | Qty: 18 | Fill #2

## 2018-01-04 MED FILL — SUMATRIPTAN 6 MG/0.5 ML REF: 6 | 28 days supply | Qty: 2 | Fill #0

## 2018-02-05 MED FILL — PANTOPRAZOLE SOD DR 40 MG T: 40 | 60 days supply | Qty: 60 | Fill #3

## 2018-02-12 DIAGNOSIS — Z1211 Encounter for screening for malignant neoplasm of colon: Secondary | ICD-10-CM | POA: Diagnosis not present

## 2018-02-12 DIAGNOSIS — K581 Irritable bowel syndrome with constipation: Secondary | ICD-10-CM | POA: Diagnosis not present

## 2018-02-12 DIAGNOSIS — R1084 Generalized abdominal pain: Secondary | ICD-10-CM | POA: Diagnosis not present

## 2018-02-12 DIAGNOSIS — K5900 Constipation, unspecified: Secondary | ICD-10-CM | POA: Diagnosis not present

## 2018-03-05 MED FILL — AMITIZA 8 MCG CAPSULE: 8 | 30 days supply | Qty: 60 | Fill #0

## 2018-04-09 MED FILL — AMITIZA 8 MCG CAPSULE: 8 | 30 days supply | Qty: 60 | Fill #1

## 2018-04-17 MED FILL — PANTOPRAZOLE SOD DR 40 MG T: 40 | 30 days supply | Qty: 30 | Fill #0

## 2018-05-08 ENCOUNTER — Telehealth: Payer: 59 | Admitting: Family Medicine

## 2018-05-08 DIAGNOSIS — R21 Rash and other nonspecific skin eruption: Secondary | ICD-10-CM

## 2018-05-08 NOTE — Progress Notes (Signed)
Based on what you shared with me it looks like you have a condition that should be evaluated in a face to face office visit. Due to the location of the rash and the wide variety of diagnosis that could cause this it is best that a provided see the rash to ensure accurate diagnosis of this condition. Apologies that we could not service your concern today on e-visits. Thanks NOTE: If you entered your credit card information for this eVisit, you will not be charged. You may see a "hold" on your card for the $30 but that hold will drop off and you will not have a charge processed.  If you are having a true medical emergency please call 911.  If you need an urgent face to face visit, [facility] has four urgent care centers for your convenience.  If you need care fast and have a high deductible or no insurance consider:   [email or web address] to reserve your spot online an avoid wait times  [facility] [address], [address] [place], [place] [address] 8 am to 8 pm Monday-Friday 10 am to 4 pm Saturday-Sunday *Across the street from [employer]  [facility]  [address] [place], [address] 8 am to 5 pm Monday-Friday * In the [facility] the [facility]   The following sites will take your  insurance:  . [facility]  [phone] Get Driving Directions Find a Provider at this Location  [address] [place], [place] [address] . 10 am to 8 pm Monday-Friday . 12 pm to 8 pm Saturday-Sunday   . [facility]  [phone] Get Driving Directions Find a Provider at this Location  [address] [place], [place] [address] . 8 am to 8 pm Monday-Friday . 9 am to 6 pm Saturday . 11 am to 6 pm Sunday   . [facility] [place]  [phone] Get Driving Directions  [address] [place], [place] [address] . 8 am to 8 pm Monday-Friday . 8  am to 4 pm Saturday-Sunday   Your e-visit answers were reviewed by a board certified advanced clinical practitioner to complete your personal care plan.  Thank you for using e-Visits.

## 2018-05-10 ENCOUNTER — Ambulatory Visit (INDEPENDENT_AMBULATORY_CARE_PROVIDER_SITE_OTHER): Payer: Self-pay | Admitting: Family Medicine

## 2018-05-10 ENCOUNTER — Encounter: Payer: Self-pay | Admitting: Family Medicine

## 2018-05-10 VITALS — BP 112/84 | HR 98 | Temp 98.7°F | Wt 160.4 lb

## 2018-05-10 DIAGNOSIS — L209 Atopic dermatitis, unspecified: Secondary | ICD-10-CM

## 2018-05-10 DIAGNOSIS — H11001 Unspecified pterygium of right eye: Secondary | ICD-10-CM

## 2018-05-10 MED ORDER — PREDNISONE 20 MG PO TABS: 40.0000 mg | ORAL_TABLET | Freq: Every day | ORAL | 0 refills | Status: AC

## 2018-05-10 MED ORDER — HYDROXYZINE HCL 10 MG PO TABS
10.0000 mg | ORAL_TABLET | Freq: Three times a day (TID) | ORAL | 0 refills | Status: DC | PRN
Start: 2018-05-10 — End: 2019-07-25

## 2018-05-10 MED FILL — hydrOXYzine HCL 10 MG TABS: 10 | 5 days supply | Qty: 15 | Fill #0

## 2018-05-10 MED FILL — predniSONE 20 MG TABS: 20 | 5 days supply | Qty: 10 | Fill #0

## 2018-05-10 NOTE — Patient Instructions (Signed)

## 2018-05-10 NOTE — Progress Notes (Signed)
Lisa Baxter is a 42 y.o. female who presents today with concerns of itching and redness to both eyes for the last 4-5 days. She has attempted to treat this condition with antihistamines with only mild relief of symptoms that return in a few hours. She denies any changes to her vision related to her symptoms. She wears glasses and reports annual eye examinations.  Review of Systems  Constitutional: Negative for chills, fever and malaise/fatigue.  HENT: Negative for congestion, ear discharge, ear pain, sinus pain and sore throat.   Eyes: Negative.  Negative for photophobia, pain, discharge and redness.  Respiratory: Negative for cough, sputum production and shortness of breath.   Cardiovascular: Negative.  Negative for chest pain.  Gastrointestinal: Negative for abdominal pain, diarrhea, nausea and vomiting.  Genitourinary: Negative for dysuria, frequency, hematuria and urgency.  Musculoskeletal: Negative for myalgias.  Skin: Positive for itching and rash.       Bilateral eyes x 4-5 days  Neurological: Negative for headaches.  Endo/Heme/Allergies: Negative.   Psychiatric/Behavioral: Negative.     O: Vitals:   05/10/18 1105  BP: 112/84  Pulse: 98  Temp: 98.7 F (37.1 C)  SpO2: 100%     Physical Exam Constitutional:      General: She is not in acute distress.    Appearance: Normal appearance. She is normal weight. She is not ill-appearing, toxic-appearing or diaphoretic.  HENT:     Head: Normocephalic.  Eyes:     General: Lids are normal. Vision grossly intact. Gaze aligned appropriately.        Right eye: No foreign body, discharge or hordeolum.        Left eye: No foreign body, discharge or hordeolum.     Extraocular Movements: Extraocular movements intact.     Right eye: Normal extraocular motion and no nystagmus.     Left eye: Normal extraocular motion and no nystagmus.      Comments: BLUE: Suspect right eye medial side pterygium RED: on medial side upper and lower  eyelid near nose bridge- mild erythema to maculopapular raised lesions- skin is intact throughout and non-fluctuant to palpation- patient denies pain but reports itching. Eye is otherwise unaffected.   Cardiovascular:     Rate and Rhythm: Normal rate and regular rhythm.  Pulmonary:     Effort: Pulmonary effort is normal.     Breath sounds: Normal breath sounds.  Neurological:     Mental Status: She is alert.    A: 1. Atopic dermatitis, unspecified type    P: Discussed exam findings, diagnosis etiology and medication use and indications reviewed with patient. Follow- Up and discharge instructions provided. No emergent/urgent issues found on exam.  Patient verbalized understanding of information provided and agrees with plan of care (POC), all questions answered.  1. Atopic dermatitis, unspecified type - predniSONE (DELTASONE) 20 MG tablet; Take 2 tablets (40 mg total) by mouth daily with breakfast for 5 days. - hydrOXYzine (ATARAX/VISTARIL) 10 MG tablet; Take 1 tablet (10 mg total) by mouth every 8 (eight) hours as needed for itching (may cause drowsiness).  2. Pterygium of right eye Follow up with eye provider for further/comprehensive evaluation, diagnosis and treatment options.

## 2018-05-14 MED FILL — AMITIZA 8 MCG CAPSULE: 8 | 30 days supply | Qty: 60 | Fill #2

## 2018-05-21 ENCOUNTER — Telehealth: Payer: 59 | Admitting: Family Medicine

## 2018-05-21 DIAGNOSIS — L209 Atopic dermatitis, unspecified: Secondary | ICD-10-CM | POA: Diagnosis not present

## 2018-05-21 MED ORDER — PREDNISONE 10 MG PO TABS: ORAL_TABLET | ORAL | 0 refills | Status: DC

## 2018-05-21 MED FILL — predniSONE 10 MG (21) TBPK: 10 | 6 days supply | Qty: 21 | Fill #0

## 2018-05-21 MED FILL — PANTOPRAZOLE SOD DR 40 MG T: 40 | 30 days supply | Qty: 30 | Fill #1

## 2018-05-21 NOTE — Progress Notes (Signed)
E Visit for Rash  We are sorry that you are not feeling well. Here is how we plan to help!  Based on what you shared with me it looks like you have contact dermatitis.  Contact dermatitis is a skin rash caused by something that touches the skin and causes irritation or inflammation.  Your skin may be red, swollen, dry, cracked, and itch.  The rash should go away in a few days but can last a few weeks.  If you get a rash, it's important to figure out what caused it so the irritant can be avoided in the future.  I believe a prednisone taper would be the best treatment and course of action for this condition. The best thing is to identify the offending allergen and remove it. Potentially taking an over the counter Zyrtec 10 mg nightly for the next 30 days would be beneficial.  Prednisone 10 mg tabs in a tapered dose starting with 60 mg, 50 mg etc. (take 1 less tablets each day with breakfast... 6,5,4,3,2,1 tablets.)   HOME CARE:   Take cool showers and avoid direct sunlight.  Apply cool compress or wet dressings.  Take a bath in an oatmeal bath.  Sprinkle content of one Aveeno packet under running faucet with comfortably warm water.  Bathe for 15-20 minutes, 1-2 times daily.  Pat dry with a towel. Do not rub the rash.  Use hydrocortisone cream.  Take an antihistamine like Benadryl for widespread rashes that itch.  The adult dose of Benadryl is 25-50 mg by mouth 4 times daily.  Caution:  This type of medication may cause sleepiness.  Do not drink alcohol, drive, or operate dangerous machinery while taking antihistamines.  Do not take these medications if you have prostate enlargement.  Read package instructions thoroughly on all medications that you take.  GET HELP RIGHT AWAY IF:   Symptoms don't go away after treatment.  Severe itching that persists.  If you rash spreads or swells.  If you rash begins to smell.  If it blisters and opens or develops a yellow-brown crust.  You  develop a fever.  You have a sore throat.  You become short of breath.  MAKE SURE YOU:  Understand these instructions. Will watch your condition. Will get help right away if you are not doing well or get worse.  Thank you for choosing an e-visit. Your e-visit answers were reviewed by a board certified advanced clinical practitioner to complete your personal care plan. Depending upon the condition, your plan could have included both over the counter or prescription medications. Please review your pharmacy choice. Be sure that the pharmacy you have chosen is open so that you can pick up your prescription now.  If there is a problem you may message your provider in MyChart to have the prescription routed to another pharmacy. Your safety is important to us. If you have drug allergies check your prescription carefully.  For the next 24 hours, you can use MyChart to ask questions about today's visit, request a non-urgent call back, or ask for a work or school excuse from your e-visit provider. You will get an email in the next two days asking about your experience. I hope that your e-visit has been valuable and will speed your recovery.

## 2018-05-22 ENCOUNTER — Other Ambulatory Visit: Payer: Self-pay | Admitting: Neurology

## 2018-05-28 DIAGNOSIS — H5203 Hypermetropia, bilateral: Secondary | ICD-10-CM | POA: Diagnosis not present

## 2018-05-28 DIAGNOSIS — H52223 Regular astigmatism, bilateral: Secondary | ICD-10-CM | POA: Diagnosis not present

## 2018-06-19 ENCOUNTER — Other Ambulatory Visit: Payer: Self-pay | Admitting: Obstetrics and Gynecology

## 2018-06-19 ENCOUNTER — Other Ambulatory Visit: Payer: Self-pay | Admitting: Neurology

## 2018-06-19 DIAGNOSIS — Z1231 Encounter for screening mammogram for malignant neoplasm of breast: Secondary | ICD-10-CM

## 2018-06-19 MED FILL — AMITIZA 8 MCG CAPSULE: 8 | 30 days supply | Qty: 60 | Fill #3

## 2018-06-21 MED FILL — PANTOPRAZOLE SOD DR 40 MG T: 40 | 30 days supply | Qty: 30 | Fill #2

## 2018-06-29 DIAGNOSIS — R351 Nocturia: Secondary | ICD-10-CM | POA: Diagnosis not present

## 2018-06-29 DIAGNOSIS — Z01419 Encounter for gynecological examination (general) (routine) without abnormal findings: Secondary | ICD-10-CM | POA: Diagnosis not present

## 2018-06-29 DIAGNOSIS — R5383 Other fatigue: Secondary | ICD-10-CM | POA: Diagnosis not present

## 2018-07-02 ENCOUNTER — Telehealth: Payer: Self-pay | Admitting: Neurology

## 2018-07-03 NOTE — Progress Notes (Signed)
PATIENT: Lisa Baxter DOB: 04-11-1976  REASON FOR VISIT: follow up HISTORY FROM: patient  Chief Complaint  Patient presents with  . Follow-up    Migraine follow up. Alone. New room. Needs a refill on her Maxalt. Patient stated that her last migraine was last week.     HISTORY OF PRESENT ILLNESS: HISTORY per Dr. Zannie CoveYan's notes: Lisa Baxter R Bryantis a 43 years old right-handed female, seen in refer by her primary care Dr. Foye DeerSteven Meyer for evaluation of chronic migraine  She reported a history of migraine since 2002, her typical migraine are lateralized severe pounding headache with associated light noise sensitivity, nausea or vomiting, she has migraine 4-5 times each year. Since 2015, she has increased headaches, couple times each months, trigger for her migraines are weather change, stress, sleep deprivation, hungry, her headache can last for a few hours to 3-5 days,  She tried Imitrex in the past, works well, currently taking Relpax, take few hours to take effect, previously she has tried Topamax as preventive medication, which does help her headaches, but she complains of slow thinking,  UPDATE September 21 2015: She is taking magnesium oxide and riboflavin twice daily, noticed mild improvement of her headache, but still frequent, she could not tolerate nortriptyline, complains of difficulty sleeping, decreased sex drive, weight gain, she has stopped taking nortriptyline now, above-mentioned symptoms has improved.  She has 2 or 3 mild to moderate headache each week, sometimes old exacerbated to a more severe migraine headaches, her mild headache responding well to Maxalt, in September 16 2015, she experienced severe prolonged headaches, presented to emergency room September 17 2015, received Benadryl, Compazine, Decadron, which helped her headache,  UPDATE June 8th 2017: She could not tolerate Topamax 100 mg twice a day, cause confusion, slow thinking, she is now only taking 100 mg every  night, Maxalt works for her migraine, but she has to take multiple doses, she has not tried Imitrex yet  We have personally reviewed MRI of the brain without contrast in May 2017, mild Arnold-Chiari malformation, normal variant, no compression.  UPDATE Sept 12 2017: She had two migraines in August, 2 headaches again over past 2 weeks,she attributed to weather changes,she took Maxalt prn, sometimes have to use 2 tablets to abort 1 headache,   She is taking Topamax 100 mg every night, has lost 9 pounds over past few months, no significant side effect notice, but morning dose does cause dizziness, sleepiness  She has to use Imitrex injection once, which did help her headache, also make her sleepy. Her 2 teenage her children also suffered typical migraine   07/29/2016:  [name] is a 43 year old female with a history of migraine headaches. She states that her headaches were doing fairly well until the last month. She states that she's had at least one headache a week for the last month. She states sometimes her headaches can last the entire week. She reports they always start above the left eye and extend across the forehead. She does have photophobia, phonophobia, nausea and vomiting. She states that typically she tries to take Maxalt and if that does not offerany relief she may have to do the injection. In the past she has tried amitriptyline and beta blocker to treat her migraines. She states that in the past she is also tried taking Topamax twice a day but it did affect her cognition. She returns today for an evaluation.  01/04/17:  [name] is a 43 year old female with a history of migraine  headaches. She returns today for follow-up. She is currently on Topamax 100 mg at bedtime as well as magnesium and riboflavin. She states that she does not take magnesium and riboflavin consistently. She states that since March she has had approximately 4-5 migraine headaches. She states that she  gets a "regular headache" every 10 days that can last up to 2 days. Her headache usually occurs across the forehead. She does have photophobia, phonophobia, nausea but no vomiting. She states occasionally with her migraines she may experience some slight weakness in the arms and legs. In the past she has been unable to tolerate higher doses of Topamax due to cognitive side effects. She returns today for an evaluation.  Today 07/11/18 AEDAN Baxter is a 43 y.o. female here today for follow up.  She reports that she is feeling well overall.  She does continue to have migraines a couple times a month.  She admits that she has not taken her magnesium and riboflavin as regularly as she should.  She is trying to get back on a schedule with this.  She is getting good relief with Imitrex injections; however, she states that this medication makes her very sleepy.  She is requesting a refill of dissolvable rizatriptan.  She can take this medication and work.  She denies use of over-the-counter analgesics.  She denies any new or worrisome symptoms.    REVIEW OF SYSTEMS: Out of a complete 14 system review of symptoms, the patient complains only of the following symptoms, fatigue, appetite change, eye discharge, constipation, diarrhea, insomnia, frequency of urination, moles, headaches, numbness, decreased concentration and all other reviewed systems are negative.  ALLERGIES: No Known Allergies  HOME MEDICATIONS: Outpatient Medications Prior to Visit  Medication Sig Dispense Refill  . AMITIZA 8 MCG capsule Take 8 mcg by mouth daily.    Marland Kitchen azithromycin (ZITHROMAX Z-PAK) 250 MG tablet As directed 6 tablet 0  . magnesium oxide (MAG-OX) 400 MG tablet Take 400 mg by mouth 2 (two) times daily.    . ondansetron (ZOFRAN) 4 MG tablet Take 1 tablet (4 mg total) by mouth every 8 (eight) hours as needed for nausea or vomiting. 30 tablet 0  . pantoprazole (PROTONIX) 40 MG tablet   3  . Riboflavin 100 MG CAPS Take by mouth  2 (two) times daily.    . SUMAtriptan Succinate Refill 6 MG/0.5ML SOCT INJECT 0.5ML AT ONSET OF MIGAINE. MAY REPEAT IN TWO HOURS IF HEADACHE PERSIST OR RECURS. 5 mL 11  . benzonatate (TESSALON PERLES) 100 MG capsule Take 1 capsule (100 mg total) by mouth 3 (three) times daily as needed for cough. (Patient not taking: Reported on 07/11/2018) 20 capsule 0  . fluticasone (FLONASE) 50 MCG/ACT nasal spray Place 2 sprays into both nostrils daily. (Patient not taking: Reported on 07/11/2018) 16 g 6  . gabapentin (NEURONTIN) 100 MG capsule Take 1 capsule PO at bedtime for 1 week, then increase to 2 capsules at bedtime for 1 week then increase to 3 capsules at bedtime thereafter (Patient not taking: Reported on 07/11/2018) 90 capsule 5  . hydrOXYzine (ATARAX/VISTARIL) 10 MG tablet Take 1 tablet (10 mg total) by mouth every 8 (eight) hours as needed for itching (may cause drowsiness). (Patient not taking: Reported on 07/11/2018) 15 tablet 0  . ketorolac (TORADOL) 10 MG tablet Take 1 tablet (10 mg total) by mouth every 6 (six) hours as needed. (Patient not taking: Reported on 07/11/2018) 20 tablet 0  . ondansetron (ZOFRAN ODT) 4 MG  disintegrating tablet Take 1 tablet (4 mg total) by mouth every 8 (eight) hours as needed for nausea or vomiting. (Patient not taking: Reported on 07/11/2018) 20 tablet 11  . polyethylene glycol powder (GLYCOLAX/MIRALAX) powder Take 17 g by mouth 2 (two) times daily as needed. (Patient not taking: Reported on 07/11/2018) 3350 g 1  . predniSONE (DELTASONE) 10 MG tablet Take 6 tablets on day 1 with breakfast and then 1 less tablet each day. (6,5,4,3,2,1) as directed. 21 tablet 0  . sulfamethoxazole-trimethoprim (BACTRIM DS) 800-160 MG tablet Take 1 tablet by mouth 2 (two) times daily. 14 tablet 0   No facility-administered medications prior to visit.     PAST MEDICAL HISTORY: Past Medical History:  Diagnosis Date  . GERD (gastroesophageal reflux disease)   . Migraine headache     PAST  SURGICAL HISTORY: Past Surgical History:  Procedure Laterality Date  . ABDOMINAL HYSTERECTOMY    . CHOLECYSTECTOMY      FAMILY HISTORY: Family History  Problem Relation Age of Onset  . Prostate cancer Father   . Hypertension Father   . Diabetes Father   . Hypertension Mother   . Asthma Mother     SOCIAL HISTORY: Social History   Socioeconomic History  . Marital status: Married    Spouse name: Not on file  . Number of children: 2  . Years of education: 11  . Highest education level: Not on file  Occupational History  . Occupation: Herbalist  Social Needs  . Financial resource strain: Not on file  . Food insecurity:    Worry: Not on file    Inability: Not on file  . Transportation needs:    Medical: Not on file    Non-medical: Not on file  Tobacco Use  . Smoking status: Never Smoker  . Smokeless tobacco: Never Used  Substance and Sexual Activity  . Alcohol use: Yes    Alcohol/week: 0.0 standard drinks    Comment: 2- 3 times weekly - small amount  . Drug use: No  . Sexual activity: Not on file  Lifestyle  . Physical activity:    Days per week: Not on file    Minutes per session: Not on file  . Stress: Not on file  Relationships  . Social connections:    Talks on phone: Not on file    Gets together: Not on file    Attends religious service: Not on file    Active member of club or organization: Not on file    Attends meetings of clubs or organizations: Not on file    Relationship status: Not on file  . Intimate partner violence:    Fear of current or ex partner: Not on file    Emotionally abused: Not on file    Physically abused: Not on file    Forced sexual activity: Not on file  Other Topics Concern  . Not on file  Social History Narrative   Lives at home with her husband and two children.   Right-handed.   Drinks small amount of caffeine 2-3 times weekly.      PHYSICAL EXAM  Vitals:   07/11/18 1014  BP: 115/77  Pulse: 74  Weight: 161  lb (73 kg)  Height: 5\' 4"  (1.626 m)   Body mass index is 27.64 kg/m.  Generalized: Well developed, in no acute distress  Cardiology: normal rate and rhythm, no murmur noted Neurological examination  Mentation: Alert oriented to time, place, history taking. Follows all commands speech  and language fluent Cranial nerve II-XII: Pupils were equal round reactive to light. Extraocular movements were full, visual field were full on confrontational test. Facial sensation and strength were normal. Uvula tongue midline. Head turning and shoulder shrug  were normal and symmetric. Motor: The motor testing reveals 5 over 5 strength of all 4 extremities. Good symmetric motor tone is noted throughout.  Sensory: Sensory testing is intact to soft touch on all 4 extremities. No evidence of extinction is noted.  Coordination: Cerebellar testing reveals good finger-nose-finger and heel-to-shin bilaterally.  Gait and station: Gait is normal. Tandem gait is normal. Romberg is negative. No drift is seen.  Reflexes: Deep tendon reflexes are symmetric and normal bilaterally.   DIAGNOSTIC DATA (LABS, IMAGING, TESTING) - I reviewed patient records, labs, notes, testing and imaging myself where available.  No flowsheet data found.   No results found for: WBC, HGB, HCT, MCV, PLT No results found for: NA, K, CL, CO2, GLUCOSE, BUN, CREATININE, CALCIUM, PROT, ALBUMIN, AST, ALT, ALKPHOS, BILITOT, GFRNONAA, GFRAA No results found for: CHOL, HDL, LDLCALC, LDLDIRECT, TRIG, CHOLHDL No results found for: [id number] No results found for: VITAMINB12 No results found for: TSH    ASSESSMENT AND PLAN 43 y.o. year old female  has a past medical history of GERD (gastroesophageal reflux disease) and Migraine headache. here with     ICD-10-CM   1. Chronic migraine G43.709 rizatriptan (MAXALT-MLT) 10 MG disintegrating tablet    She is doing well overall. She admits that she has not taken magnesium and riboflavin as directed and  plans to work on taking these more regularly. She tolerates Imitrex injections but reports that these make her sleepy. She is asking for rizatriptan as well that she can take if at work. I have called in prescription. I have advised that she should not take more than 10 doses total of the triptans. If she is having to use one or the other more than 10 times total, we should consider different preventative therapy. She verbalizes understanding. We will follow up in 1 year, sooner if needed.    No orders of the defined types were placed in this encounter.    Meds ordered this encounter  Medications  . rizatriptan (MAXALT-MLT) 10 MG disintegrating tablet    Sig: Take 1 tablet (10 mg total) by mouth as needed for migraine. May repeat in 2 hours if needed    Dispense:  9 tablet    Refill:  11    Order Specific Question:   Supervising Provider    Answer:   Anson FretAHERN, ANTONIA B [id number]]      I spent 15 minutes with the patient. 50% of this time was spent counseling and educating patient on plan of care and medications.    Shawnie Dappermy Margarett Viti, FNP-C 07/11/2018, 10:54 AM [facility] [address], [address] [place], [place] [address] [phone]

## 2018-07-11 ENCOUNTER — Ambulatory Visit: Payer: 59 | Admitting: Family Medicine

## 2018-07-11 ENCOUNTER — Encounter: Payer: Self-pay | Admitting: Family Medicine

## 2018-07-11 VITALS — BP 115/77 | HR 74 | Ht 64.0 in | Wt 161.0 lb

## 2018-07-11 DIAGNOSIS — IMO0002 Reserved for concepts with insufficient information to code with codable children: Secondary | ICD-10-CM

## 2018-07-11 DIAGNOSIS — G43709 Chronic migraine without aura, not intractable, without status migrainosus: Secondary | ICD-10-CM

## 2018-07-11 MED ORDER — RIZATRIPTAN BENZOATE 10 MG PO TBDP: 10.0000 mg | ORAL_TABLET | ORAL | 11 refills | Status: DC | PRN

## 2018-07-11 MED FILL — RIZATRIPTAN 10 MG ODT: 10 | 15 days supply | Qty: 9 | Fill #0

## 2018-07-11 NOTE — Patient Instructions (Addendum)
Continue Mgnesium and Riboflavin as directed Maxalt (rizatriptan) as prescribed, Sumatriptan injections as prescribed.  Do not take either medication more than 10 times total per month, if needing triptan more than 10 times per month we need to consider different preventative medications.     Migraine Headache  A migraine headache is a very strong throbbing pain on one side or both sides of your head. Migraines can also cause other symptoms. Talk with your doctor about what things may bring on (trigger) your migraine headaches. Follow these instructions at home: Medicines  Take over-the-counter and prescription medicines only as told by your doctor.  Do not drive or use heavy machinery while taking prescription pain medicine.  To prevent or treat constipation while you are taking prescription pain medicine, your doctor may recommend that you: ? Drink enough fluid to keep your pee (urine) clear or pale yellow. ? Take over-the-counter or prescription medicines. ? Eat foods that are high in fiber. These include fresh fruits and vegetables, whole grains, and beans. ? Limit foods that are high in fat and processed sugars. These include fried and sweet foods. Lifestyle  Avoid alcohol.  Do not use any products that contain nicotine or tobacco, such as cigarettes and e-cigarettes. If you need help quitting, ask your doctor.  Get at least 8 hours of sleep every night.  Limit your stress. General instructions   Keep a journal to find out what may bring on your migraines. For example, write down: ? What you eat and drink. ? How much sleep you get. ? Any change in what you eat or drink. ? Any change in your medicines.  If you have a migraine: ? Avoid things that make your symptoms worse, such as bright lights. ? It may help to lie down in a dark, quiet room. ? Do not drive or use heavy machinery. ? Ask your doctor what activities are safe for you.  Keep all follow-up visits as told  by your doctor. This is important. Contact a doctor if:  You get a migraine that is different or worse than your usual migraines. Get help right away if:  Your migraine gets very bad.  You have a fever.  You have a stiff neck.  You have trouble seeing.  Your muscles feel weak or like you cannot control them.  You start to lose your balance a lot.  You start to have trouble walking.  You pass out (faint). This information is not intended to replace advice given to you by your health care provider. Make sure you discuss any questions you have with your health care provider. Document Released: 02/23/2008 Document Revised: 02/07/2018 Document Reviewed: 11/02/2015 Elsevier Interactive Patient Education  2019 [employer].

## 2018-07-12 NOTE — Telephone Encounter (Signed)
error 

## 2018-07-12 NOTE — Progress Notes (Signed)
I have reviewed and agreed above plan. 

## 2018-07-18 ENCOUNTER — Ambulatory Visit
Admission: RE | Admit: 2018-07-18 | Discharge: 2018-07-18 | Disposition: A | Discharge status: Home / Self Care | Payer: 59 | Source: Ambulatory Visit | Attending: Obstetrics and Gynecology | Admitting: Obstetrics and Gynecology

## 2018-07-18 DIAGNOSIS — Z1231 Encounter for screening mammogram for malignant neoplasm of breast: Secondary | ICD-10-CM

## 2018-07-26 MED FILL — PANTOPRAZOLE SOD DR 40 MG T: 40 | 30 days supply | Qty: 30 | Fill #3

## 2018-07-26 MED FILL — AMITIZA 8 MCG CAPSULE: 8 | 30 days supply | Qty: 60 | Fill #0

## 2018-08-01 ENCOUNTER — Telehealth: Payer: 59 | Admitting: Family

## 2018-08-01 DIAGNOSIS — J069 Acute upper respiratory infection, unspecified: Secondary | ICD-10-CM

## 2018-08-01 MED ORDER — FLUTICASONE PROPIONATE 50 MCG/ACT NA SUSP: 2.0000 | Freq: Every day | NASAL | 6 refills | Status: DC

## 2018-08-01 NOTE — Progress Notes (Signed)
We are sorry you are not feeling well.  Here is how we plan to help!  Based on what you have shared with me, it looks like you may have a viral upper respiratory infection.  Upper respiratory infections are caused by a large number of viruses; however, rhinovirus is the most common cause.   Approximately five minutes was spent reviewing the document and patient's chart.  Symptoms vary from person to person, with common symptoms including sore throat, cough, and fatigue or lack of energy.  A low-grade fever of up to 100.4 may present, but is often uncommon.  Symptoms vary however, and are closely related to a person's age or underlying illnesses.  The most common symptoms associated with an upper respiratory infection are nasal discharge or congestion, cough, sneezing, headache and pressure in the ears and face.  These symptoms usually persist for about 3 to 10 days, but can last up to 2 weeks.  It is important to know that upper respiratory infections do not cause serious illness or complications in most cases.    Upper respiratory infections can be transmitted from person to person, with the most common method of transmission being a person's hands.  The virus is able to live on the skin and can infect other persons for up to 2 hours after direct contact.  Also, these can be transmitted when someone coughs or sneezes; thus, it is important to cover the mouth to reduce this risk.  To keep the spread of the illness at bay, good hand hygiene is very important.  This is an infection that is most likely caused by a virus. There are no specific treatments other than to help you with the symptoms until the infection runs its course.  We are sorry you are not feeling well.  Here is how we plan to help!   For nasal congestion, you may use an oral decongestants such as Mucinex D or if you have glaucoma or high blood pressure use plain Mucinex.  Saline nasal spray or nasal drops can help and can safely be used as  often as needed for congestion.  For your congestion, I have prescribed Fluticasone nasal spray one spray in each nostril twice a day  If you do not have a history of heart disease, hypertension, diabetes or thyroid disease, prostate/bladder issues or glaucoma, you may also use Sudafed to treat nasal congestion.  It is highly recommended that you consult with a pharmacist or your primary care physician to ensure this medication is safe for you to take.     If you have a cough, you may use cough suppressants such as Delsym and Robitussin.  If you have glaucoma or high blood pressure, you can also use Coricidin HBP.    Providers prescribe antibiotics to treat infections caused by bacteria. Antibiotics are very powerful in treating bacterial infections when they are used properly. To maintain their effectiveness, they should be used only when necessary. Overuse of antibiotics has resulted in the development of superbugs that are resistant to treatment!    After careful review of your answers, I would not recommend an antibiotic for your condition.  Antibiotics are not effective against viruses and therefore should not be used to treat them. Common examples of infections caused by viruses include colds and flu    If you have a sore or scratchy throat, use a saltwater gargle-  to  teaspoon of salt dissolved in a 4-ounce to 8-ounce glass of warm water.  Gargle  the solution for approximately 15-30 seconds and then spit.  It is important not to swallow the solution.  You can also use throat lozenges/cough drops and Chloraseptic spray to help with throat pain or discomfort.  Warm or cold liquids can also be helpful in relieving throat pain.  For headache, pain or general discomfort, you can use Ibuprofen or Tylenol as directed.   Some authorities believe that zinc sprays or the use of Echinacea may shorten the course of your symptoms.   HOME CARE . Only take medications as instructed by your medical  team. . Be sure to drink plenty of fluids. Water is fine as well as fruit juices, sodas and electrolyte beverages. You may want to stay away from caffeine or alcohol. If you are nauseated, try taking small sips of liquids. How do you know if you are getting enough fluid? Your urine should be a pale yellow or almost colorless. . Get rest. . Taking a steamy shower or using a humidifier may help nasal congestion and ease sore throat pain. You can place a towel over your head and breathe in the steam from hot water coming from a faucet. . Using a saline nasal spray works much the same way. . Cough drops, hard candies and sore throat lozenges may ease your cough. . Avoid close contacts especially the very young and the elderly . Cover your mouth if you cough or sneeze . Always remember to wash your hands.   GET HELP RIGHT AWAY IF: . You develop worsening fever. . If your symptoms do not improve within 10 days . You develop yellow or green discharge from your nose over 3 days. . You have coughing fits . You develop a severe head ache or visual changes. . You develop shortness of breath, difficulty breathing or start having chest pain . Your symptoms persist after you have completed your treatment plan  MAKE SURE YOU   Understand these instructions.  Will watch your condition.  Will get help right away if you are not doing well or get worse.  Your e-visit answers were reviewed by a board certified advanced clinical practitioner to complete your personal care plan. Depending upon the condition, your plan could have included both over the counter or prescription medications. Please review your pharmacy choice. If there is a problem, you may call our nursing hot line at and have the prescription routed to another pharmacy. Your safety is important to [place]. If you have drug allergies check your prescription carefully.   You can use MyChart to ask questions about today's visit, request a non-urgent  call back, or ask for a work or school excuse for 24 hours related to this e-Visit. If it has been greater than 24 hours you will need to follow up with your provider, or enter a new e-Visit to address those concerns. You will get an e-mail in the next two days asking about your experience.  I hope that your e-visit has been valuable and will speed your recovery. Thank you for using e-visits.

## 2018-08-02 MED FILL — FLUTICASONE PROP 50 MCG SPR: 50 | 30 days supply | Qty: 16 | Fill #0

## 2018-08-24 MED FILL — AMITIZA 8 MCG CAPSULE: 8 | 30 days supply | Qty: 60 | Fill #1

## 2018-08-27 MED FILL — PANTOPRAZOLE SOD DR 40 MG T: 40 | 30 days supply | Qty: 30 | Fill #4

## 2018-08-27 MED FILL — RIZATRIPTAN 10 MG ODT: 10 | 15 days supply | Qty: 9 | Fill #1

## 2018-10-01 MED FILL — PANTOPRAZOLE SOD DR 40 MG T: 40 | 30 days supply | Qty: 30 | Fill #5

## 2018-10-01 MED FILL — AMITIZA 8 MCG CAPSULE: 8 | 30 days supply | Qty: 60 | Fill #2

## 2018-10-30 MED FILL — AMITIZA 8 MCG CAPSULE: 8 | 30 days supply | Qty: 60 | Fill #3

## 2018-10-30 MED FILL — PANTOPRAZOLE SOD DR 40 MG T: 40 | 30 days supply | Qty: 30 | Fill #0

## 2018-11-29 DIAGNOSIS — K219 Gastro-esophageal reflux disease without esophagitis: Secondary | ICD-10-CM | POA: Diagnosis not present

## 2018-11-29 DIAGNOSIS — G43909 Migraine, unspecified, not intractable, without status migrainosus: Secondary | ICD-10-CM | POA: Diagnosis not present

## 2018-11-29 DIAGNOSIS — K5901 Slow transit constipation: Secondary | ICD-10-CM | POA: Diagnosis not present

## 2018-11-29 DIAGNOSIS — R1084 Generalized abdominal pain: Secondary | ICD-10-CM | POA: Diagnosis not present

## 2018-11-29 DIAGNOSIS — Z131 Encounter for screening for diabetes mellitus: Secondary | ICD-10-CM | POA: Diagnosis not present

## 2018-11-29 DIAGNOSIS — Z13220 Encounter for screening for lipoid disorders: Secondary | ICD-10-CM | POA: Diagnosis not present

## 2018-11-29 DIAGNOSIS — Z0000 Encounter for general adult medical examination without abnormal findings: Secondary | ICD-10-CM | POA: Diagnosis not present

## 2018-11-29 DIAGNOSIS — E538 Deficiency of other specified B group vitamins: Secondary | ICD-10-CM | POA: Diagnosis not present

## 2018-12-04 MED FILL — PANTOPRAZOLE SOD DR 40 MG T: 40 | 30 days supply | Qty: 30 | Fill #1

## 2018-12-04 MED FILL — AMITIZA 8 MCG CAPSULE: 8 | 30 days supply | Qty: 60 | Fill #4

## 2018-12-11 ENCOUNTER — Telehealth: Payer: 59

## 2019-01-07 MED FILL — PANTOPRAZOLE SOD DR 40 MG T: 40 | 30 days supply | Qty: 30 | Fill #2

## 2019-01-07 MED FILL — AMITIZA 8 MCG CAPSULE: 8 | 30 days supply | Qty: 60 | Fill #5

## 2019-01-30 DIAGNOSIS — K581 Irritable bowel syndrome with constipation: Secondary | ICD-10-CM | POA: Diagnosis not present

## 2019-02-06 MED FILL — RIZATRIPTAN 10 MG ODT: 10 | 15 days supply | Qty: 9 | Fill #2

## 2019-02-06 MED FILL — PANTOPRAZOLE SOD DR 40 MG T: 40 | 30 days supply | Qty: 30 | Fill #3

## 2019-02-07 MED FILL — AMITIZA 8 MCG CAPSULE: 8 | 30 days supply | Qty: 60 | Fill #0

## 2019-02-22 DIAGNOSIS — K136 Irritative hyperplasia of oral mucosa: Secondary | ICD-10-CM | POA: Diagnosis not present

## 2019-02-22 MED FILL — IBUPROFEN 600 MG TABLET: 600 | 5 days supply | Qty: 20 | Fill #0

## 2019-02-22 MED FILL — HYDROCODON-APAP 5-325: 5-325 | 2 days supply | Qty: 8 | Fill #0

## 2019-03-06 MED FILL — AMITIZA 8 MCG CAPSULE: 8 | 30 days supply | Qty: 60 | Fill #1

## 2019-03-11 DIAGNOSIS — K219 Gastro-esophageal reflux disease without esophagitis: Secondary | ICD-10-CM | POA: Diagnosis not present

## 2019-03-11 DIAGNOSIS — Z1211 Encounter for screening for malignant neoplasm of colon: Secondary | ICD-10-CM | POA: Diagnosis not present

## 2019-03-11 DIAGNOSIS — K5900 Constipation, unspecified: Secondary | ICD-10-CM | POA: Diagnosis not present

## 2019-03-29 ENCOUNTER — Emergency Department
Admission: EM | Admit: 2019-03-29 | Discharge: 2019-03-29 | Disposition: A | Discharge status: Home / Self Care | Payer: 59 | Source: Home / Self Care

## 2019-03-29 ENCOUNTER — Encounter: Payer: Self-pay | Admitting: Emergency Medicine

## 2019-03-29 ENCOUNTER — Other Ambulatory Visit: Payer: Self-pay

## 2019-03-29 ENCOUNTER — Emergency Department (INDEPENDENT_AMBULATORY_CARE_PROVIDER_SITE_OTHER): Payer: 59

## 2019-03-29 DIAGNOSIS — R109 Unspecified abdominal pain: Secondary | ICD-10-CM

## 2019-03-29 DIAGNOSIS — R1031 Right lower quadrant pain: Secondary | ICD-10-CM

## 2019-03-29 DIAGNOSIS — R140 Abdominal distension (gaseous): Secondary | ICD-10-CM

## 2019-03-29 DIAGNOSIS — R197 Diarrhea, unspecified: Secondary | ICD-10-CM | POA: Diagnosis not present

## 2019-03-29 DIAGNOSIS — R198 Other specified symptoms and signs involving the digestive system and abdomen: Secondary | ICD-10-CM

## 2019-03-29 LAB — POCT CBC W AUTO DIFF (K'VILLE URGENT CARE)

## 2019-03-29 LAB — POCT URINALYSIS DIP (MANUAL ENTRY)
Bilirubin, UA: NEGATIVE
Glucose, UA: NEGATIVE mg/dL
Ketones, POC UA: NEGATIVE mg/dL
Leukocytes, UA: NEGATIVE
Nitrite, UA: NEGATIVE
Protein Ur, POC: NEGATIVE mg/dL
Spec Grav, UA: <=1.005 — AB (ref 1.010–1.025)
Urobilinogen, UA: 0.2 E.U./dL
pH, UA: 6 (ref 5.0–8.0)

## 2019-03-29 LAB — COMPLETE METABOLIC PANEL WITH GFR
AG Ratio: 1.5 (calc) (ref 1.0–2.5)
ALT: 9 U/L (ref 6–29)
AST: 16 U/L (ref 10–35)
Albumin: 4.2 g/dL (ref 3.6–5.1)
Alkaline phosphatase (APISO): 96 U/L (ref 31–125)
BUN: 8 mg/dL (ref 7–25)
CO2: 30 mmol/L (ref 20–32)
Calcium: 9.3 mg/dL (ref 8.6–10.2)
Chloride: 105 mmol/L (ref 98–110)
Creat: 0.84 mg/dL (ref 0.50–1.10)
GFR, Est African American: 97 mL/min/{1.73_m2} (ref >=60)
GFR, Est Non African American: 84 mL/min/{1.73_m2} (ref >=60)
Globulin: 2.8 g/dL (calc) (ref 1.9–3.7)
Glucose, Bld: 94 mg/dL (ref 65–99)
Potassium: 4.3 mmol/L (ref 3.5–5.3)
Sodium: 141 mmol/L (ref 135–146)
Total Bilirubin: 0.7 mg/dL (ref 0.2–1.2)
Total Protein: 7 g/dL (ref 6.1–8.1)

## 2019-03-29 LAB — LIPASE: Lipase: <5 U/L — ABNORMAL LOW (ref 7–60)

## 2019-03-29 MED ORDER — ONDANSETRON 4 MG PO TBDP: 4.0000 mg | ORAL_TABLET | Freq: Three times a day (TID) | ORAL | 0 refills | Status: DC | PRN

## 2019-03-29 MED ORDER — ONDANSETRON 4 MG PO TBDP
4.0000 mg | ORAL_TABLET | Freq: Once | ORAL | Status: AC
  Administered 2019-03-29: 4 mg via ORAL

## 2019-03-29 MED ORDER — IOHEXOL 300 MG/ML  SOLN
100.0000 mL | Freq: Once | INTRAMUSCULAR | Status: AC | PRN
  Administered 2019-03-29: 100 mL via INTRAVENOUS

## 2019-03-29 MED FILL — ONDANSETRON ODT 4 MG TABLET: 4 | 3 days supply | Qty: 8 | Fill #0

## 2019-03-29 NOTE — ED Triage Notes (Signed)
Reports onset of right upper and mid abdominal pain about 2 days ago; some nausea, no vomiting , took laxative herbal tea so more frequent stools; feels bloated. She also has a headache.Took ibuprofen at 1030 and headache milder.

## 2019-03-29 NOTE — Discharge Instructions (Signed)
°  Please follow up with family medicine next week if not improving. Please also call to schedule/establish care with a new primary care provider for ongoing healthcare needs.  Call 911 or go to the hospital if symptoms worsening or new concerning symptoms develop- worsening pain, fever, unable to keep down fluids, etc.

## 2019-03-29 NOTE — ED Provider Notes (Signed)
[facility]    CSN: [id number] Arrival date & time: 03/29/19  1104      History   Chief Complaint Chief Complaint  Patient presents with  . Abdominal Pain    HPI Lisa Baxter is a 43 y.o. female.   HPI Lisa Baxter is a 43 y.o. female presenting to UC with c/o Right side upper and mid abdominal pain radiating into lower abdomen the last 2 days. Mild nausea but no vomiting.  Decreased appetite due to stomach discomfort.  She has felt constipated so she drank a laxative tea, which normally helps but she has still felt constipated and bloated.  Denies fever, chills, vomiting or diarrhea.  Hx of IBS, symptoms feel similar but more intense.  Denies urinary symptoms. She has not f/u with GI since last year, currently looking for a new GI specialist.   She also reports a mild generalized HA. She took ibuprofen around 1030AM with moderate relief of her headache.  Abdominal surgical hx significant for cholecystectomy  and hysterectomy.  No prior hx of bowel obstruction.    Past Medical History:  Diagnosis Date  . GERD (gastroesophageal reflux disease)   . Migraine headache     Patient Active Problem List   Diagnosis Date Noted  . Sudden onset of severe headache 09/21/2015  . Chronic migraine 06/23/2015    Past Surgical History:  Procedure Laterality Date  . ABDOMINAL HYSTERECTOMY    . CHOLECYSTECTOMY      OB History   No obstetric history on file.      Home Medications    Prior to Admission medications   Medication Sig Start Date End Date Taking? Authorizing Provider  AMITIZA 8 MCG capsule Take 8 mcg by mouth daily. 06/19/18   [provider]  azithromycin (ZITHROMAX Z-PAK) 250 MG tablet As directed 08/09/17   Hassell Done, Mary-Margaret, FNP  benzonatate (TESSALON PERLES) 100 MG capsule Take 1 capsule (100 mg total) by mouth 3 (three) times daily as needed for cough. Patient not taking: Reported on 07/11/2018 08/09/17   Chevis Pretty, FNP   fluticasone [facility]) 50 MCG/ACT nasal spray Place 2 sprays into both nostrils daily. 08/01/18   Sharion Balloon, FNP  gabapentin (NEURONTIN) 100 MG capsule Take 1 capsule PO at bedtime for 1 week, then increase to 2 capsules at bedtime for 1 week then increase to 3 capsules at bedtime thereafter Patient not taking: Reported on 07/11/2018 01/04/17   Ward Givens, NP  hydrOXYzine (ATARAX/VISTARIL) 10 MG tablet Take 1 tablet (10 mg total) by mouth every 8 (eight) hours as needed for itching (may cause drowsiness). Patient not taking: Reported on 07/11/2018 05/10/18   Shella Maxim, NP  ketorolac (TORADOL) 10 MG tablet Take 1 tablet (10 mg total) by mouth every 6 (six) hours as needed. Patient not taking: Reported on 07/11/2018 06/01/17   Barnet Glasgow, NP  magnesium oxide (MAG-OX) 400 MG tablet Take 400 mg by mouth 2 (two) times daily.    [provider]  ondansetron (ZOFRAN ODT) 4 MG disintegrating tablet Take 1 tablet (4 mg total) by mouth every 8 (eight) hours as needed. 03/29/19   Noe Gens, PA-C  ondansetron (ZOFRAN) 4 MG tablet Take 1 tablet (4 mg total) by mouth every 8 (eight) hours as needed for nausea or vomiting. 06/01/17   Barnet Glasgow, NP  pantoprazole (PROTONIX) 40 MG tablet  12/28/16   [provider]  polyethylene glycol powder (GLYCOLAX/MIRALAX) powder Take 17 g by mouth 2 (two)  times daily as needed. Patient not taking: Reported on 07/11/2018 06/01/17   Dorena Bodo, NP  Riboflavin 100 MG CAPS Take by mouth 2 (two) times daily.    [provider]  rizatriptan (MAXALT-MLT) 10 MG disintegrating tablet Take 1 tablet (10 mg total) by mouth as needed for migraine. May repeat in 2 hours if needed 07/11/18   Lomax, Amy, NP  SUMAtriptan Succinate Refill 6 MG/0.5ML SOCT INJECT 0.5ML AT ONSET OF MIGAINE. MAY REPEAT IN TWO HOURS IF HEADACHE PERSIST OR RECURS. 08/31/17   Butch Penny, NP    Family History Family History  Problem Relation Age of Onset  .  Prostate cancer Father   . Hypertension Father   . Diabetes Father   . Hypertension Mother   . Asthma Mother   . Breast cancer Neg Hx     Social History Social History   Tobacco Use  . Smoking status: Never Smoker  . Smokeless tobacco: Never Used  Substance Use Topics  . Alcohol use: Yes    Alcohol/week: 0.0 standard drinks    Comment: 2- 3 times weekly - small amount  . Drug use: No     Allergies   Patient has no known allergies.   Review of Systems Review of Systems  Constitutional: Positive for appetite change. Negative for chills, fever and unexpected weight change.  Gastrointestinal: Positive for abdominal distention, abdominal pain, constipation and nausea. Negative for blood in stool and vomiting.  Genitourinary: Negative for dysuria, flank pain and frequency.  Musculoskeletal: Negative for back pain.  Neurological: Positive for headaches. Negative for dizziness and light-headedness.     Physical Exam Triage Vital Signs ED Triage Vitals  Enc Vitals Group     BP 03/29/19 1138 (!) 144/87     Pulse Rate 03/29/19 1138 75     Resp 03/29/19 1138 16     Temp 03/29/19 1138 98.5 F (36.9 C)     Temp src --      SpO2 03/29/19 1138 100 %     Weight 03/29/19 1144 151 lb (68.5 kg)     Height 03/29/19 1144 5\' 5"  (1.651 m)     Head Circumference --      Peak Flow --      Pain Score 03/29/19 1143 7     Pain Loc --      Pain Edu? --      Excl. in GC? --    No data found.  Updated Vital Signs BP (!) 144/87 (BP Location: Right Arm)   Pulse 75   Temp 98.5 F (36.9 C)   Resp 16   Ht 5\' 5"  (1.651 m)   Wt 151 lb (68.5 kg)   SpO2 100%   BMI 25.13 kg/m   Visual Acuity Right Eye Distance:   Left Eye Distance:   Bilateral Distance:    Right Eye Near:   Left Eye Near:    Bilateral Near:     Physical Exam Vitals signs and nursing note reviewed.  Constitutional:      Appearance: She is well-developed.  HENT:     Head: Normocephalic and atraumatic.  Neck:      Musculoskeletal: Normal range of motion.  Cardiovascular:     Rate and Rhythm: Normal rate and regular rhythm.  Pulmonary:     Effort: Pulmonary effort is normal. No respiratory distress.     Breath sounds: Normal breath sounds.  Abdominal:     General: There is no distension.     Palpations:  Abdomen is soft.     Tenderness: There is abdominal tenderness in the right upper quadrant, right lower quadrant, epigastric area and periumbilical area. There is no right CVA tenderness, left CVA tenderness, guarding or rebound. Negative signs include Murphy's sign and McBurney's sign.     Hernia: No hernia is present.  Musculoskeletal: Normal range of motion.  Skin:    General: Skin is warm and dry.  Neurological:     Mental Status: She is alert and oriented to person, place, and time.  Psychiatric:        Behavior: Behavior normal.      UC Treatments / Results  Labs (all labs ordered are listed, but only abnormal results are displayed) Labs Reviewed  LIPASE - Abnormal; Notable for the following components:      Result Value   Lipase <5 (*)    All other components within normal limits  POCT URINALYSIS DIP (MANUAL ENTRY) - Abnormal; Notable for the following components:   Color, UA light yellow (*)    Spec Grav, UA <=1.005 (*)    Blood, UA trace-lysed (*)    All other components within normal limits  COMPLETE METABOLIC PANEL WITH GFR  POCT CBC W AUTO DIFF ([facility])    EKG   Radiology Ct Abdomen Pelvis W Contrast  Result Date: 03/29/2019 CLINICAL DATA:  Right lower quadrant abdominal pain, nausea, headache, bloating EXAM: CT ABDOMEN AND PELVIS WITH CONTRAST TECHNIQUE: Multidetector CT imaging of the abdomen and pelvis was performed using the standard protocol following bolus administration of intravenous contrast. CONTRAST:  100mL OMNIPAQUE IOHEXOL 300 MG/ML  SOLN COMPARISON:  06/28/2017 FINDINGS: Lower chest: No acute abnormality. Hepatobiliary: Left hepatic dome  hypodense subcentimeter focus noted, suspect small hepatic cyst but too small to definitively characterize, image 17 series 2. No other significant hepatic abnormality. Remote cholecystectomy. No biliary dilatation or obstruction. Common bile duct nondilated. Pancreas: Unremarkable. No pancreatic ductal dilatation or surrounding inflammatory changes. Spleen: Normal in size without focal abnormality. Adrenals/Urinary Tract: Adrenal glands are unremarkable. Kidneys are normal, without renal calculi, focal lesion, or hydronephrosis. Bladder is unremarkable. Stomach/Bowel: Negative for bowel obstruction, significant dilatation, ileus, free air. Cecum is low-lying extending into the pelvis overlying the iliac vessels with mild fluid distension and air-fluid levels remain nonspecific. Appendix not visualized but no acute inflammatory process in the right lower quadrant appreciated. No free fluid, fluid collection, hemorrhage, ascites or abscess. Vascular/Lymphatic: Intact aorta. Negative for aneurysm. Mesenteric and renal vasculature remain patent. No veno-occlusive process. No bulky adenopathy Reproductive: Status post hysterectomy. No adnexal masses. Other: No abdominal wall hernia or abnormality. No abdominopelvic ascites. Musculoskeletal: No acute osseous finding IMPRESSION: No definite acute intra-abdominopelvic finding by CT. Remote cholecystectomy Nonspecific low lying cecum extending into the pelvis with stool and fluid distension and air-fluid levels but no associated wall thickening. This can be seen with ongoing diarrhea. Appendix not visualized. Electronically Signed   By: Judie PetitM.  Shick M.D.   On: 03/29/2019 14:10    Procedures Procedures (including critical care time)  Medications Ordered in UC Medications  ondansetron (ZOFRAN-ODT) disintegrating tablet 4 mg (4 mg Oral Given 03/29/19 1211)    Initial Impression / Assessment and Plan / UC Course  I have reviewed the triage vital signs and the nursing  notes.  Pertinent labs & imaging results that were available during my care of the patient were reviewed by me and considered in my medical decision making (see chart for details).     Discussed imaging with  pt Although appendix not visualized, appendicitis less likely at this time. CBC: WNL, pt is afebrile Discussed signs/symptoms to go to emergency department this evening or this weekend if new or worsening symptoms develop. Encouraged f/u with PCP and GI next week if not improving. Pt verbalized understanding and agreement with tx plan AVS provided.  Final Clinical Impressions(s) / UC Diagnoses   Final diagnoses:  RLQ abdominal pain  Abdominal cramping  Bloating  Alternating constipation and diarrhea     Discharge Instructions      Please follow up with family medicine next week if not improving. Please also call to schedule/establish care with a new primary care provider for ongoing healthcare needs.  Call 911 or go to the hospital if symptoms worsening or new concerning symptoms develop- worsening pain, fever, unable to keep down fluids, etc.    ED Prescriptions    Medication Sig Dispense Auth. Provider   ondansetron (ZOFRAN ODT) 4 MG disintegrating tablet Take 1 tablet (4 mg total) by mouth every 8 (eight) hours as needed. 8 tablet Lurene Shadow, [place]     PDMP not reviewed this encounter.   Lurene Shadow, PA-C 03/29/19 1659

## 2019-04-12 MED FILL — AMITIZA 8 MCG CAPSULE: 8 | 30 days supply | Qty: 60 | Fill #2

## 2019-04-12 MED FILL — PANTOPRAZOLE SOD DR 40 MG T: 40 | 30 days supply | Qty: 30 | Fill #5

## 2019-05-22 MED FILL — PANTOPRAZOLE SOD DR 40 MG T: 40 | 90 days supply | Qty: 90 | Fill #0

## 2019-05-27 MED FILL — AMITIZA 8 MCG CAPSULE: 8 | 30 days supply | Qty: 60 | Fill #0

## 2019-06-17 DIAGNOSIS — H52223 Regular astigmatism, bilateral: Secondary | ICD-10-CM | POA: Diagnosis not present

## 2019-06-17 DIAGNOSIS — H5203 Hypermetropia, bilateral: Secondary | ICD-10-CM | POA: Diagnosis not present

## 2019-06-24 ENCOUNTER — Other Ambulatory Visit: Payer: Self-pay | Admitting: Obstetrics and Gynecology

## 2019-06-24 DIAGNOSIS — Z1231 Encounter for screening mammogram for malignant neoplasm of breast: Secondary | ICD-10-CM

## 2019-07-01 DIAGNOSIS — Z1211 Encounter for screening for malignant neoplasm of colon: Secondary | ICD-10-CM | POA: Diagnosis not present

## 2019-07-01 DIAGNOSIS — Z01419 Encounter for gynecological examination (general) (routine) without abnormal findings: Secondary | ICD-10-CM | POA: Diagnosis not present

## 2019-07-10 MED FILL — LUBIPROSTONE 8 MCG CAPS: 8 | 30 days supply | Qty: 60 | Fill #1

## 2019-07-25 ENCOUNTER — Emergency Department
Admission: EM | Admit: 2019-07-25 | Discharge: 2019-07-25 | Disposition: A | Discharge status: Home / Self Care | Payer: 59 | Source: Home / Self Care

## 2019-07-25 ENCOUNTER — Other Ambulatory Visit: Payer: Self-pay

## 2019-07-25 ENCOUNTER — Telehealth: Payer: Self-pay | Admitting: Internal Medicine

## 2019-07-25 ENCOUNTER — Encounter: Payer: Self-pay | Admitting: Emergency Medicine

## 2019-07-25 ENCOUNTER — Emergency Department (INDEPENDENT_AMBULATORY_CARE_PROVIDER_SITE_OTHER): Payer: 59

## 2019-07-25 DIAGNOSIS — M545 Low back pain, unspecified: Secondary | ICD-10-CM

## 2019-07-25 DIAGNOSIS — R1030 Lower abdominal pain, unspecified: Secondary | ICD-10-CM

## 2019-07-25 DIAGNOSIS — R109 Unspecified abdominal pain: Secondary | ICD-10-CM

## 2019-07-25 LAB — POCT URINALYSIS DIP (MANUAL ENTRY)
Bilirubin, UA: NEGATIVE
Glucose, UA: NEGATIVE mg/dL
Ketones, POC UA: NEGATIVE mg/dL
Leukocytes, UA: NEGATIVE
Nitrite, UA: NEGATIVE
Protein Ur, POC: NEGATIVE mg/dL
Spec Grav, UA: 1.015 (ref 1.010–1.025)
Urobilinogen, UA: 0.2 E.U./dL
pH, UA: 5.5 (ref 5.0–8.0)

## 2019-07-25 MED ORDER — MELOXICAM 15 MG PO TABS: 15.0000 mg | ORAL_TABLET | Freq: Every day | ORAL | 1 refills | Status: DC

## 2019-07-25 MED ORDER — DICYCLOMINE HCL 20 MG PO TABS: 20.0000 mg | ORAL_TABLET | Freq: Two times a day (BID) | ORAL | 0 refills | Status: DC

## 2019-07-25 MED ORDER — KETOROLAC TROMETHAMINE 60 MG/2ML IM SOLN
60.0000 mg | Freq: Once | INTRAMUSCULAR | Status: AC
  Administered 2019-07-25: 60 mg via INTRAMUSCULAR

## 2019-07-25 MED ORDER — CYCLOBENZAPRINE HCL 10 MG PO TABS: 10.0000 mg | ORAL_TABLET | Freq: Every day | ORAL | 0 refills | Status: DC

## 2019-07-25 MED FILL — DICYCLOMINE 20 MG TABLET: 20 | 30 days supply | Qty: 90 | Fill #0

## 2019-07-25 MED FILL — CYCLOBENZAPRINE HCL 10 MG T: 10 | 20 days supply | Qty: 20 | Fill #0

## 2019-07-25 MED FILL — ONDANSETRON ODT 4 MG TABLET: 4 | 3 days supply | Qty: 8 | Fill #0

## 2019-07-25 MED FILL — MELOXICAM 15 MG TABLET: 15 | 30 days supply | Qty: 30 | Fill #0

## 2019-07-25 NOTE — Telephone Encounter (Signed)
Patient called to schedule appt with Dr. Leone Payor medical records to be sent over from [place].

## 2019-07-25 NOTE — Discharge Instructions (Addendum)
Take dicyclomine as directed until you follow-up with your GI specialist. If abdominal pain worsens or you are unable to pass gas or have a bowel movement go immediately to the emergency department for further evaluation. Start meloxicam once daily as needed for back pain.  Recommend discontinuing ibuprofen as I think this is also worsening bloating and GI symptoms.  I have also prescribed some Flexeril at bedtime to take as needed for back pain.

## 2019-07-25 NOTE — ED Provider Notes (Signed)
[facility]    CSN: [id number] Arrival date & time: 07/25/19  1130      History   Chief Complaint Chief Complaint  Patient presents with  . Back Pain    HPI Lisa Baxter is a 44 y.o. female.   HPI  Patient presents today with acute onset left-sided lower back pain with coexisting bilateral lower abdominal pain.  Patient has no history of known renal stones.  Lisa Baxter does endorse a history of IBS.  Lisa Baxter also complains of associated nausea without vomiting and looseness of stools.  No recent changes in diet no known injury to back.  No known exposure to COVID-19. Denies dysuria or changes in urine color or consistency.  Lisa Baxter has attempted relief with heat application and ibuprofen with temporary improvement however the symptoms have returned and have worsened since yesterday.  In review of EMR patient has had an abnormal CT of the abdomen which showed a low lying cecum which was thought to be the cause of the chronic lower abdominal/pelvic pain.  Patient has been followed by gastroenterology at [facility] and today attempted to obtain an appointment with Dr. Leone Payor initially has not had significant improvement in her symptoms.  Lisa Baxter takes Amitiza and has been instructed to take MiraLAX daily by her PCP and prior GI specialist however Lisa Baxter had stopped taking the MiraLAX daily due to her symptoms of diarrhea and constipation continue to be present.  Lisa Baxter felt the MiraLAX was causing more gas and bloating however has resumed drinking daily.  The back pain is newer symptom.  Lisa Baxter has no history of renal stone. Past Medical History:  Diagnosis Date  . GERD (gastroesophageal reflux disease)   . Migraine headache     Patient Active Problem List   Diagnosis Date Noted  . Sudden onset of severe headache 09/21/2015  . Chronic migraine 06/23/2015    Past Surgical History:  Procedure Laterality Date  . ABDOMINAL HYSTERECTOMY    . CHOLECYSTECTOMY      OB History   No obstetric history on  file.      Home Medications    Prior to Admission medications   Medication Sig Start Date End Date Taking? Authorizing Provider  AMITIZA 8 MCG capsule Take 8 mcg by mouth daily. 06/19/18   [provider]  ketorolac (TORADOL) 10 MG tablet Take 1 tablet (10 mg total) by mouth every 6 (six) hours as needed. Patient not taking: Reported on 07/11/2018 06/01/17   Dorena Bodo, NP  magnesium oxide (MAG-OX) 400 MG tablet Take 400 mg by mouth 2 (two) times daily.    [provider]  ondansetron (ZOFRAN ODT) 4 MG disintegrating tablet Take 1 tablet (4 mg total) by mouth every 8 (eight) hours as needed. 03/29/19   Lurene Shadow, PA-C  pantoprazole (PROTONIX) 40 MG tablet  12/28/16   [provider]  polyethylene glycol powder (GLYCOLAX/MIRALAX) powder Take 17 g by mouth 2 (two) times daily as needed. Patient not taking: Reported on 07/11/2018 06/01/17   Dorena Bodo, NP  Riboflavin 100 MG CAPS Take by mouth 2 (two) times daily.    [provider]  rizatriptan (MAXALT-MLT) 10 MG disintegrating tablet Take 1 tablet (10 mg total) by mouth as needed for migraine. May repeat in 2 hours if needed 07/11/18   Lomax, Amy, NP  SUMAtriptan Succinate Refill 6 MG/0.5ML SOCT INJECT 0.5ML AT ONSET OF MIGAINE. MAY REPEAT IN TWO HOURS IF HEADACHE PERSIST OR RECURS. 08/31/17   Butch Penny, NP  Family History Family History  Problem Relation Age of Onset  . Prostate cancer Father   . Hypertension Father   . Diabetes Father   . Hypertension Mother   . Asthma Mother   . Breast cancer Neg Hx     Social History Social History   Tobacco Use  . Smoking status: Never Smoker  . Smokeless tobacco: Never Used  Substance Use Topics  . Alcohol use: Yes    Alcohol/week: 0.0 standard drinks    Comment: 2- 3 times weekly - small amount  . Drug use: No     Allergies   Patient has no known allergies.   Review of Systems Review of Systems Pertinent negatives listed in  HPI  Physical Exam Triage Vital Signs ED Triage Vitals  Enc Vitals Group     BP 07/25/19 1153 (!) 142/89     Pulse Rate 07/25/19 1153 86     Resp --      Temp 07/25/19 1153 98.9 F (37.2 C)     Temp Source 07/25/19 1153 Oral     SpO2 07/25/19 1153 100 %     Weight 07/25/19 1156 150 lb (68 kg)     Height 07/25/19 1156 5\' 4"  (1.626 m)     Head Circumference --      Peak Flow --      Pain Score 07/25/19 1155 9     Pain Loc --      Pain Edu? --      Excl. in [place]? --    No data found.  Updated Vital Signs BP (!) 142/89 (BP Location: Right Arm)   Pulse 86   Temp 98.9 F (37.2 C) (Oral)   Ht 5\' 4"  (1.626 m)   Wt 150 lb (68 kg)   SpO2 100%   BMI 25.75 kg/m   Visual Acuity Right Eye Distance:   Left Eye Distance:   Bilateral Distance:    Right Eye Near:   Left Eye Near:    Bilateral Near:     Physical Exam Constitutional:      Appearance: Lisa Baxter is not ill-appearing.  HENT:     Mouth/Throat:     Mouth: Mucous membranes are dry.  Eyes:     Extraocular Movements: Extraocular movements intact.     Pupils: Pupils are equal, round, and reactive to light.  Cardiovascular:     Rate and Rhythm: Normal rate and regular rhythm.     Pulses: Normal pulses.     Heart sounds: Normal heart sounds.  Pulmonary:     Effort: Pulmonary effort is normal.     Breath sounds: Normal breath sounds.  Abdominal:     General: Bowel sounds are normal. There is no distension.     Tenderness: There is abdominal tenderness in the periumbilical area and suprapubic area. There is no right CVA tenderness, left CVA tenderness or guarding.  Musculoskeletal:        General: Normal range of motion.     Cervical back: Normal range of motion.  Skin:    General: Skin is warm.  Neurological:     Mental Status: Lisa Baxter is alert and oriented to person, place, and time.  Psychiatric:        Mood and Affect: Mood normal.      UC Treatments / Results  Labs (all labs ordered are listed, but only abnormal  results are displayed) Labs Reviewed  POCT URINALYSIS DIP (MANUAL ENTRY)    EKG   Radiology No results  found.  Procedures Procedures (including critical care time)  Medications Ordered in UC Medications  ketorolac (TORADOL) injection 60 mg (60 mg Intramuscular Given 07/25/19 1305)    Initial Impression / Assessment and Plan / UC Course  I have reviewed the triage vital signs and the nursing notes.  Pertinent labs & imaging results that were available during my care of the patient were reviewed by me and considered in my medical decision making (see chart for details).     Final Clinical Impressions(s) / UC Diagnoses   Final diagnoses:  Low back pain without sciatica, unspecified back pain laterality, unspecified chronicity  Lower abdominal pain  Flank pain, acute   Patient with a history of chronic IBS , chronic lower abdominal pain with recent onset of low back pain. UA and abdominal plain flims negative for calculi which would indicate renal stone. Recent CT of abdomen (10/20), revealed low lying cecum as the possible cause of irregularity in bowel consistently. Patient referred to [facility] and we were able to scheduled an appointment  for patient . Recommended a trial of dicyclomine for abdominal pain, Meloxicam and flexeril for back pain. Toradol given here in clinic. Strict follow-up precautions advised. Patient verbalized understanding and agreement with plan.w Discharge Instructions     Take dicyclomine as directed until you follow-up with your GI specialist. If abdominal pain worsens or you are unable to pass gas or have a bowel movement go immediately to the emergency department for further evaluation. Start meloxicam once daily as needed for back pain.  Recommend discontinuing ibuprofen as I think this is also worsening bloating and GI symptoms.  I have also prescribed some Flexeril at bedtime to take as needed for back pain.    ED Prescriptions    Medication Sig  Dispense Auth. Provider   dicyclomine (BENTYL) 20 MG tablet  (Status: Discontinued) Take 1 tablet (20 mg total) by mouth 3 (three) times daily - between meals and at bedtime. 20 tablet Bing Neighbors, FNP   cyclobenzaprine (FLEXERIL) 10 MG tablet Take 1 tablet (10 mg total) by mouth at bedtime. 20 tablet Bing Neighbors, FNP   meloxicam (MOBIC) 15 MG tablet Take 1 tablet (15 mg total) by mouth daily. 30 tablet Bing Neighbors, FNP   dicyclomine (BENTYL) 20 MG tablet Take 1 tablet (20 mg total) by mouth 3 (three) times daily - between meals and at bedtime. 90 tablet Bing Neighbors, FNP     PDMP not reviewed this encounter.   Bing Neighbors, FNP 07/27/19 [phone]

## 2019-07-25 NOTE — ED Triage Notes (Addendum)
Low Left back pain x 1 week got better after using heating pad, started again 2 days ago worse yesterday.Lower abdominal pain.

## 2019-07-31 ENCOUNTER — Other Ambulatory Visit: Payer: Self-pay

## 2019-07-31 ENCOUNTER — Ambulatory Visit
Admission: RE | Admit: 2019-07-31 | Discharge: 2019-07-31 | Disposition: A | Discharge status: Home / Self Care | Payer: 59 | Source: Ambulatory Visit | Attending: Obstetrics and Gynecology | Admitting: Obstetrics and Gynecology

## 2019-07-31 DIAGNOSIS — Z1231 Encounter for screening mammogram for malignant neoplasm of breast: Secondary | ICD-10-CM | POA: Diagnosis not present

## 2019-08-17 MED FILL — LUBIPROSTONE 8 MCG CAPS: 8 | 30 days supply | Qty: 60 | Fill #2

## 2019-08-20 ENCOUNTER — Encounter: Payer: Self-pay | Admitting: Internal Medicine

## 2019-08-20 ENCOUNTER — Ambulatory Visit (INDEPENDENT_AMBULATORY_CARE_PROVIDER_SITE_OTHER): Payer: 59 | Admitting: Internal Medicine

## 2019-08-20 VITALS — BP 118/74 | HR 92 | Temp 97.8°F | Ht 64.0 in | Wt 156.8 lb

## 2019-08-20 DIAGNOSIS — R143 Flatulence: Secondary | ICD-10-CM

## 2019-08-20 DIAGNOSIS — K581 Irritable bowel syndrome with constipation: Secondary | ICD-10-CM | POA: Diagnosis not present

## 2019-08-20 DIAGNOSIS — K2100 Gastro-esophageal reflux disease with esophagitis, without bleeding: Secondary | ICD-10-CM

## 2019-08-20 DIAGNOSIS — R140 Abdominal distension (gaseous): Secondary | ICD-10-CM

## 2019-08-20 DIAGNOSIS — K5909 Other constipation: Secondary | ICD-10-CM | POA: Diagnosis not present

## 2019-08-20 NOTE — Progress Notes (Signed)
Lisa Baxter 44 y.o. [birth year] [id number]  Assessment & Plan:   Encounter Diagnoses  Name Primary?  . Chronic constipation Yes  . Bloating   . Flatulence   . Irritable bowel syndrome with constipation   . Gastroesophageal reflux disease with esophagitis, unspecified whether hemorrhage    It does sound like she has constipation predominant IBS.  Also GERD.  Main bothersome issue is the constipation bloating gaseousness borborygmi.  We will concentrate on this first.  Given that she has years of acid suppression she is at increased risk for small intestinal bacterial overgrowth and she could have an excess of methane producing bacteria which is associated with constipation bloating etc. and her clinical pattern.  So first order of business is a small intestinal bacterial overgrowth test with lactulose hydrogen breath testing.  Once I get those results back we will determine the next step.  If positive will treat for small intestinal bacterial overgrowth with antibiotics.  She will continue her current PPI and we can follow-up on the GERD later.  We do not know exactly when she had a colonoscopy before it is probably been less than 10 years but she is not sure, I do not think a colonoscopy is of primary importance now but we need to consider a screening colonoscopy at some point going forward this year or next year most likely.  She will see if she can find those records or at least who and where she had the procedure such that we might requested.  I have explained that the recent AGA guidelines on probiotics do not support their use in her situation she thinks she might of had some benefit initially but is not really sure if they help so she can discontinue a daily probiotic which she has been taking for months if not longer.  I appreciate the opportunity to care for this patient. CC: Joycelyn Rua, MD   Subjective:   Chief Complaint: Chronic constipation  reflux  HPI Lisa Baxter is a 43 year old married African-American woman with many years of constipation issues that have worsened with time, previously seen in [place] where she had a colonoscopy sometime before twenty fifteen that was reported as normal, and after moving here became established with Dr. Bosie Clos at [facility] and was recommended to take probiotics, Amitiza at 8 mcg twice daily and Metamucil.  She has had variable success but has never felt completely well and has incomplete defecation and gaseousness malodorous flatus and borborygmi issues.  Off and on she has had some right lower quadrant pain, she went to urgent care in late twenty twenty and had a CT of the abdomen and pelvis that demonstrated low-lying cecum with some stool and fluid distention and air-fluid levels but no significant pathology.  The date of that exam 03/29/2019.  I have reviewed records of her [facility] visits as well as the urgent care visits lab CT scan.  Her thyroid function is normal in the last year.  She stopped taking Metamucil because it caused mucus and that scared her.  She was advised by primary care to use MiraLAX more regularly and she has been on the and stools are soft to loose, along with the Amitiza.  She calls this the lesser of two evils.  Celiac testing has been negative 2019 neg TTG Ab and NL IgA  Initially was treated with Dexilant in [place], an EGD apparently showed esophagitis, and did very well but then because of insurance had to move to  pantoprazole which does not there was good of a job. No Known Allergies Current Meds  Medication Sig  . AMITIZA 8 MCG capsule Take 8 mcg by mouth daily.  . cyclobenzaprine (FLEXERIL) 10 MG tablet Take 1 tablet (10 mg total) by mouth at bedtime. (Patient taking differently: Take 10 mg by mouth at bedtime as needed. )  . dicyclomine (BENTYL) 20 MG tablet Take 1 tablet (20 mg total) by mouth 3 (three) times daily - between meals and at bedtime. (Patient  taking differently: Take 20 mg by mouth 3 (three) times daily - between meals and at bedtime. As needed)  . ondansetron (ZOFRAN ODT) 4 MG disintegrating tablet Take 1 tablet (4 mg total) by mouth every 8 (eight) hours as needed.  . pantoprazole (PROTONIX) 40 MG tablet Take 40 mg by mouth daily.   . polyethylene glycol powder (GLYCOLAX/MIRALAX) powder Take 17 g by mouth 2 (two) times daily as needed. (Patient taking differently: Take 17 g by mouth daily. )  . rizatriptan (MAXALT-MLT) 10 MG disintegrating tablet Take 1 tablet (10 mg total) by mouth as needed for migraine. May repeat in 2 hours if needed  . SUMAtriptan Succinate Refill 6 MG/0.5ML SOCT INJECT 0.5ML AT ONSET OF MIGAINE. MAY REPEAT IN TWO HOURS IF HEADACHE PERSIST OR RECURS.   Past Medical History:  Diagnosis Date  . GERD (gastroesophageal reflux disease)   . Irritable bowel syndrome    with constipation  . Migraine headache    Past Surgical History:  Procedure Laterality Date  . ABDOMINAL HYSTERECTOMY     partial  . CHOLECYSTECTOMY    . COLONOSCOPY    . DILATION AND CURETTAGE OF UTERUS  1999  . ESOPHAGOGASTRODUODENOSCOPY    . TUBAL LIGATION  2005  . UTERINE FIBROID SURGERY     Social History   Social History Narrative   Lives at home with her husband and two children. 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   Drinks small amount of caffeine 2-3 times weekly.   family history includes Asthma in her mother, sister, and sister; Diabetes in her father and paternal grandmother; Hypertension in her father and mother; Prostate cancer in her father.   Review of Systems As per HPI, some recent low back pain seen in urgent care  Objective:   Physical Exam BP 118/74   Pulse 92   Temp 97.8 F (36.6 C)   Ht 5\' 4"  (1.626 m)   Wt 156 lb 12.8 oz (71.1 kg)   BMI 26.91 kg/m  NAD middle-aged black woman  appearing well Eyes are anicteric Pleasant mood and affect overall normal Alert and oriented  Patti Martinique, CMA present.  abd soft NT BS_+ no HSM/mass    Anoderm inspection revealed  No abnormalities Anal wink was absent Digital exam revealed normal resting tone and voluntary squeeze. No mass or rectocele present. Simulated defecation with valsalva revealed appropriate abdominal contraction and descent.

## 2019-08-20 NOTE — Patient Instructions (Signed)
You have been given a testing kit to check for small intestine bacterial overgrowth (SIBO) which is completed by a company named [facility]. Make sure to return your test in the mail using the return mailing label given to you along with the kit. Your demographic and insurance information have already been sent to the company and they should be in contact with you over the next week regarding this test. [facility] will collect an upfront charge of $99.74 for commercial insurance plans and $209.74 is you are paying cash. Make sure to discuss with [facility] PRIOR to having the test if they have gotten informatoin from your insurance company as to how much your testing will cost out of pocket, if any. Please keep in mind that you will be getting a call from phone number [phone] or a similar number. If you do not hear from them within this time frame, please call our office at [phone].   I appreciate the opportunity to care for you. Carl Gessner, MD, FACG 

## 2019-08-26 ENCOUNTER — Other Ambulatory Visit: Payer: Self-pay | Admitting: *Deleted

## 2019-08-26 DIAGNOSIS — IMO0002 Reserved for concepts with insufficient information to code with codable children: Secondary | ICD-10-CM

## 2019-08-26 DIAGNOSIS — G43709 Chronic migraine without aura, not intractable, without status migrainosus: Secondary | ICD-10-CM

## 2019-08-26 MED ORDER — RIZATRIPTAN BENZOATE 10 MG PO TBDP: 10.0000 mg | ORAL_TABLET | ORAL | 0 refills | Status: DC | PRN

## 2019-08-26 MED FILL — RIZATRIPTAN 10 MG ODT: 10 | 15 days supply | Qty: 9 | Fill #0

## 2019-09-09 ENCOUNTER — Telehealth: Payer: Self-pay | Admitting: Internal Medicine

## 2019-09-09 ENCOUNTER — Encounter: Payer: Self-pay | Admitting: Internal Medicine

## 2019-09-09 DIAGNOSIS — R140 Abdominal distension (gaseous): Secondary | ICD-10-CM | POA: Diagnosis not present

## 2019-09-09 MED FILL — PANTOPRAZOLE SOD DR 40 MG T: 40 | 90 days supply | Qty: 90 | Fill #1

## 2019-09-09 NOTE — Telephone Encounter (Signed)
Informed patient that we have already faxed a clinical form with a signature on it prior to her doing the kit. Patient verbalized understanding and will leave that part blank.

## 2019-09-09 NOTE — Telephone Encounter (Signed)
Pt stated that she is filling out SIBO paperwork and asked about "clinical signature" required.

## 2019-09-16 ENCOUNTER — Telehealth: Payer: Self-pay | Admitting: Internal Medicine

## 2019-09-16 NOTE — Telephone Encounter (Signed)
[facility] called stating that they are missing order form for SIBO test. Pls fax it to [phone].

## 2019-09-17 NOTE — Telephone Encounter (Signed)
Order faxed.

## 2019-09-23 MED FILL — LUBIPROSTONE 8 MCG CAPS: 8 | 30 days supply | Qty: 60 | Fill #3

## 2019-09-27 ENCOUNTER — Telehealth: Payer: Self-pay | Admitting: Internal Medicine

## 2019-09-27 ENCOUNTER — Encounter: Payer: Self-pay | Admitting: Internal Medicine

## 2019-09-27 DIAGNOSIS — K638219 Small intestinal bacterial overgrowth, unspecified: Secondary | ICD-10-CM

## 2019-09-27 DIAGNOSIS — K6389 Other specified diseases of intestine: Secondary | ICD-10-CM

## 2019-09-27 HISTORY — DX: Other specified diseases of intestine: K63.89

## 2019-09-27 HISTORY — DX: Small intestinal bacterial overgrowth, unspecified: K63.8219

## 2019-09-27 MED ORDER — RIFAXIMIN 550 MG PO TABS: 550.0000 mg | ORAL_TABLET | Freq: Three times a day (TID) | ORAL | 0 refills | Status: AC

## 2019-09-27 MED ORDER — PANTOPRAZOLE SODIUM 20 MG PO TBEC: 20.0000 mg | DELAYED_RELEASE_TABLET | Freq: Every day | ORAL | 0 refills | Status: DC

## 2019-09-27 MED FILL — PANTOPRAZOLE SOD DR 20 MG T: 20 | 90 days supply | Qty: 90 | Fill #0

## 2019-09-27 NOTE — Telephone Encounter (Signed)
+   lactulose H2 breath test called to patient  Plans:  1) Xifaxan 550 mg tid x 14 days (samples) 2) Taper pantoprazole - start w/ 40 mg alternating 20 mg daily x 2 weeks then 20 mg daily x 2 weeks then 20 mg every other day and then eventually stop 3) use famotidine 20 mg 1-2 as needed 2-3 times a day while tapering and also use Mylanta, Gaviscon or Tums as needed (all over the counter)  4) message me in 2 weeks after Xifaxan completed w/ symptom update

## 2019-10-24 MED FILL — LUBIPROSTONE 8 MCG CAPS: 8 | 30 days supply | Qty: 60 | Fill #4

## 2019-11-22 ENCOUNTER — Other Ambulatory Visit: Payer: Self-pay | Admitting: Internal Medicine

## 2019-11-22 MED ORDER — RIFAXIMIN 550 MG PO TABS: 550.0000 mg | ORAL_TABLET | Freq: Three times a day (TID) | ORAL | 0 refills | Status: DC

## 2019-11-29 MED FILL — LUBIPROSTONE 8 MCG CAPS: 8 | 30 days supply | Qty: 60 | Fill #5

## 2020-01-07 DIAGNOSIS — Z20822 Contact with and (suspected) exposure to covid-19: Secondary | ICD-10-CM | POA: Diagnosis not present

## 2020-01-17 ENCOUNTER — Ambulatory Visit (INDEPENDENT_AMBULATORY_CARE_PROVIDER_SITE_OTHER): Payer: 59 | Admitting: Internal Medicine

## 2020-01-17 ENCOUNTER — Encounter: Payer: Self-pay | Admitting: Internal Medicine

## 2020-01-17 VITALS — BP 120/78 | HR 72 | Ht 65.0 in | Wt 148.0 lb

## 2020-01-17 DIAGNOSIS — K5909 Other constipation: Secondary | ICD-10-CM

## 2020-01-17 DIAGNOSIS — Z1211 Encounter for screening for malignant neoplasm of colon: Secondary | ICD-10-CM | POA: Diagnosis not present

## 2020-01-17 DIAGNOSIS — K6389 Other specified diseases of intestine: Secondary | ICD-10-CM

## 2020-01-17 DIAGNOSIS — R198 Other specified symptoms and signs involving the digestive system and abdomen: Secondary | ICD-10-CM

## 2020-01-17 MED ORDER — AMITIZA 8 MCG PO CAPS: 8.0000 ug | ORAL_CAPSULE | Freq: Two times a day (BID) | ORAL | 3 refills | Status: DC

## 2020-01-17 NOTE — Progress Notes (Signed)
Lisa Baxter 44 y.o. 10/05/1975 [id number]  Assessment & Plan:   Encounter Diagnoses  Name Primary?  . Small intestinal bacterial overgrowth Yes  . Borborygmi   . Chronic constipation   . Special screening for malignant neoplasms, colon     She is clinically much better though not asymptomatic.  She will continue current regimen and also do the following:   Stop MiraLAX If persistent loose stools despite that stop the Amitiza Try to reduce or limit FODMAPs which may help prevent recurrence of SIBO.  Hopefully since she is off chronic acid suppression that will go a long way towards preventing recurrent symptomatic SIBO.  Schedule screening colonoscopy The risks and benefits as well as alternatives of endoscopic procedure(s) have been discussed and reviewed. All questions answered. The patient agrees to proceed.   Further plans pending clinical course  I appreciate the opportunity to care for this patient. CC: Lisa Rua, MD   Subjective:   Chief Complaint: Follow-up of abdominal pain reflux symptoms and constipation, recently diagnosed small intestinal bacterial overgrowth  HPI Lisa Baxter is a 44 year old African-American woman with a history of small intestinal bacterial overgrowth diagnosed by lactulose hydrogen breath test earlier this year, status post 2 rounds of treatment with Xifaxan.  Her symptoms of bloating and constipation and abdominal pain and borborygmi are all significantly better.  However she does have difficulty with borborygmi still and it bothers her.  There is occasional right lower quadrant pain that is minor.  She used to struggle with constipation and remains on MiraLAX daily and is on 8 mcg Amitiza daily.  Stools are loose and sometimes formed but very thin.  This has been happening over the past couple of weeks.  She was treated with chronic PPI therapy and is weaned off that.  Heartburn maybe once a week not a bother easily treated with  over-the-counter agents.  Overall she is feeling significantly better though not asymptomatic.  We never did get records of a previous colonoscopy it probably was about 10 years or so ago and she is interested in pursuing a screening colonoscopy.   Wt Readings from Last 3 Encounters:  01/17/20 148 lb (67.1 kg)  08/20/19 156 lb 12.8 oz (71.1 kg)  07/25/19 150 lb (68 kg)    No Known Allergies Current Meds  Medication Sig  . AMITIZA 8 MCG capsule Take 8 mcg by mouth daily.  . cyclobenzaprine (FLEXERIL) 10 MG tablet Take 10 mg by mouth 3 (three) times daily as needed for muscle spasms.  . ondansetron (ZOFRAN ODT) 4 MG disintegrating tablet Take 1 tablet (4 mg total) by mouth every 8 (eight) hours as needed.  . polyethylene glycol powder (GLYCOLAX/MIRALAX) powder Take 17 g by mouth 2 (two) times daily as needed. (Patient taking differently: Take 17 g by mouth daily. )  . rizatriptan (MAXALT-MLT) 10 MG disintegrating tablet Take 1 tablet (10 mg total) by mouth as needed for migraine. May repeat in 2 hours if needed  . SUMAtriptan Succinate Refill 6 MG/0.5ML SOCT INJECT 0.5ML AT ONSET OF MIGAINE. MAY REPEAT IN TWO HOURS IF HEADACHE PERSIST OR RECURS. (Patient taking differently: Inject 0.79ml at onset of migaine.  May repeat in two hours if headache persist or recurs. As needed)   Past Medical History:  Diagnosis Date  . GERD (gastroesophageal reflux disease)   . Irritable bowel syndrome    with constipation  . Migraine headache   . Small intestinal bacterial overgrowth 09/27/2019   Past Surgical History:  Procedure Laterality Date  . ABDOMINAL HYSTERECTOMY     partial  . CHOLECYSTECTOMY    . COLONOSCOPY    . DILATION AND CURETTAGE OF UTERUS  1999  . ESOPHAGOGASTRODUODENOSCOPY    . TUBAL LIGATION  2005  . UTERINE FIBROID SURGERY     Social History   Social History Narrative   Lives at home with her husband and two children. 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   Drinks small amount of caffeine 2-3 times weekly.   family history includes Asthma in her mother, sister, and sister; Diabetes in her father and paternal grandmother; Hypertension in her father and mother; Prostate cancer in her father.   Review of Systems As above Objective:   Physical Exam BP 120/78   Pulse 72   Ht 5\' 5"  (1.651 m)   Wt 148 lb (67.1 kg)   BMI 24.63 kg/m   23 minutes total time

## 2020-01-17 NOTE — Patient Instructions (Signed)
You have been scheduled for a colonoscopy. Please follow written instructions given to you at your visit today.  Please pick up your prep supplies at the pharmacy within the next 1-3 days. If you use inhalers (even only as needed), please bring them with you on the day of your procedure.  We have sent the following medications to your pharmacy for you to pick up at your convenience: Amitza   Normal BMI (Body Mass Index- based on height and weight) is between 19 and 25. Your BMI today is Body mass index is 24.63 kg/m. Marland Kitchen Please consider follow up  regarding your BMI with your Primary Care Provider.   Stop your Miralax and then if still having loose stools in a week also stop the Amitza.   I appreciate the opportunity to care for you. Stan Head, MD, [facility]

## 2020-01-20 DIAGNOSIS — G43909 Migraine, unspecified, not intractable, without status migrainosus: Secondary | ICD-10-CM | POA: Diagnosis not present

## 2020-01-20 DIAGNOSIS — Z0000 Encounter for general adult medical examination without abnormal findings: Secondary | ICD-10-CM | POA: Diagnosis not present

## 2020-01-20 DIAGNOSIS — Z13220 Encounter for screening for lipoid disorders: Secondary | ICD-10-CM | POA: Diagnosis not present

## 2020-01-20 DIAGNOSIS — K580 Irritable bowel syndrome with diarrhea: Secondary | ICD-10-CM | POA: Diagnosis not present

## 2020-01-20 DIAGNOSIS — K219 Gastro-esophageal reflux disease without esophagitis: Secondary | ICD-10-CM | POA: Diagnosis not present

## 2020-01-23 ENCOUNTER — Other Ambulatory Visit: Payer: Self-pay | Admitting: Internal Medicine

## 2020-01-23 MED FILL — LUBIPROSTONE 8 MCG CAPS: 8 | 90 days supply | Qty: 180 | Fill #0

## 2020-01-30 ENCOUNTER — Encounter: Payer: Self-pay | Admitting: Internal Medicine

## 2020-02-11 ENCOUNTER — Encounter: Payer: 59 | Admitting: Internal Medicine

## 2020-02-11 ENCOUNTER — Telehealth: Payer: 59 | Admitting: Physician Assistant

## 2020-02-11 DIAGNOSIS — J069 Acute upper respiratory infection, unspecified: Secondary | ICD-10-CM

## 2020-02-11 MED ORDER — FLUTICASONE PROPIONATE 50 MCG/ACT NA SUSP: 2.0000 | Freq: Every day | NASAL | 0 refills | Status: DC

## 2020-02-11 MED ORDER — AMOXICILLIN-POT CLAVULANATE 875-125 MG PO TABS: 1.0000 | ORAL_TABLET | Freq: Two times a day (BID) | ORAL | 0 refills | Status: AC

## 2020-02-11 MED ORDER — BENZONATATE 100 MG PO CAPS: 100.0000 mg | ORAL_CAPSULE | Freq: Three times a day (TID) | ORAL | 0 refills | Status: AC

## 2020-02-11 MED FILL — FLUTICASONE PROP 50 MCG SPR: 50 | 30 days supply | Qty: 16 | Fill #0

## 2020-02-11 MED FILL — AMOX-CLAV 875-125 MG TABLET: 875-125 | 10 days supply | Qty: 20 | Fill #0

## 2020-02-11 MED FILL — BENZONATATE 100 MG CAPS: 100 | 5 days supply | Qty: 15 | Fill #0

## 2020-02-11 NOTE — Progress Notes (Signed)
We are sorry you are not feeling well.  Here is how we plan to help!  Based on what you have shared with me, it looks like you may have a viral upper respiratory infection.  Upper respiratory infections are caused by a large number of viruses; however, rhinovirus is the most common cause.   Symptoms vary from person to person, with common symptoms including sore throat, cough, fatigue or lack of energy and feeling of general discomfort.  A low-grade fever of up to 100.4 may present, but is often uncommon.  Symptoms vary however, and are closely related to a person's age or underlying illnesses.  The most common symptoms associated with an upper respiratory infection are nasal discharge or congestion, cough, sneezing, headache and pressure in the ears and face.  These symptoms usually persist for about 3 to 10 days, but can last up to 2 weeks.  It is important to know that upper respiratory infections do not cause serious illness or complications in most cases.    Upper respiratory infections can be transmitted from person to person, with the most common method of transmission being a person's hands.  The virus is able to live on the skin and can infect other persons for up to 2 hours after direct contact.  Also, these can be transmitted when someone coughs or sneezes; thus, it is important to cover the mouth to reduce this risk.  To keep the spread of the illness at bay, good hand hygiene is very important.  This is an infection that is most likely caused by a virus. There are no specific treatments other than to help you with the symptoms until the infection runs its course.  We are sorry you are not feeling well.  Here is how we plan to help!   For nasal congestion, you may use an oral decongestants such as Mucinex D or if you have glaucoma or high blood pressure use plain Mucinex.  Saline nasal spray or nasal drops can help and can safely be used as often as needed for congestion.  For your congestion,  I have prescribed Fluticasone nasal spray one spray in each nostril twice a day  If you do not have a history of heart disease, hypertension, diabetes or thyroid disease, prostate/bladder issues or glaucoma, you may also use Sudafed to treat nasal congestion.  It is highly recommended that you consult with a pharmacist or your primary care physician to ensure this medication is safe for you to take.     If you have a cough, you may use cough suppressants such as Delsym and Robitussin.  If you have glaucoma or high blood pressure, you can also use Coricidin HBP.   For cough I have prescribed for you A prescription cough medication called Tessalon Perles 100 mg. You may take 1-2 capsules every 8 hours as needed for cough   Most upper respiratory infections are viral, however given your significant sore throat I have prescribed an antibiotic called Augmentin to take twice daily for the next 10 days. This will cover a potential strep throat and will also cover bacterial sinusitis.  If you have a sore or scratchy throat, use a saltwater gargle-  to  teaspoon of salt dissolved in a 4-ounce to 8-ounce glass of warm water.  Gargle the solution for approximately 15-30 seconds and then spit.  It is important not to swallow the solution.  You can also use throat lozenges/cough drops and Chloraseptic spray to help with throat  pain or discomfort.  Warm or cold liquids can also be helpful in relieving throat pain.  For headache, pain or general discomfort, you can use Ibuprofen or Tylenol as directed.   Some authorities believe that zinc sprays or the use of Echinacea may shorten the course of your symptoms.   HOME CARE . Only take medications as instructed by your medical team. . Be sure to drink plenty of fluids. Water is fine as well as fruit juices, sodas and electrolyte beverages. You may want to stay away from caffeine or alcohol. If you are nauseated, try taking small sips of liquids. How do you know if  you are getting enough fluid? Your urine should be a pale yellow or almost colorless. . Get rest. . Taking a steamy shower or using a humidifier may help nasal congestion and ease sore throat pain. You can place a towel over your head and breathe in the steam from hot water coming from a faucet. . Using a saline nasal spray works much the same way. . Cough drops, hard candies and sore throat lozenges may ease your cough. . Avoid close contacts especially the very young and the elderly . Cover your mouth if you cough or sneeze . Always remember to wash your hands.   GET HELP RIGHT AWAY IF: . You develop worsening fever. . If your symptoms do not improve within 10 days . You develop yellow or green discharge from your nose over 3 days. . You have coughing fits . You develop a severe head ache or visual changes. . You develop shortness of breath, difficulty breathing or start having chest pain . Your symptoms persist after you have completed your treatment plan  MAKE SURE YOU   Understand these instructions.  Will watch your condition.  Will get help right away if you are not doing well or get worse.  Your e-visit answers were reviewed by a board certified advanced clinical practitioner to complete your personal care plan. Depending upon the condition, your plan could have included both over the counter or prescription medications. Please review your pharmacy choice. If there is a problem, you may call our nursing hot line at and have the prescription routed to another pharmacy. Your safety is important to [place]. If you have drug allergies check your prescription carefully.   You can use MyChart to ask questions about today's visit, request a non-urgent call back, or ask for a work or school excuse for 24 hours related to this e-Visit. If it has been greater than 24 hours you will need to follow up with your provider, or enter a new e-Visit to address those concerns. You will get an e-mail  in the next two days asking about your experience.  I hope that your e-visit has been valuable and will speed your recovery. Thank you for using e-visits.   Approximately 5 minutes was spent documenting and reviewing patient's chart.

## 2020-04-02 ENCOUNTER — Other Ambulatory Visit: Payer: Self-pay

## 2020-04-02 ENCOUNTER — Emergency Department
Admission: RE | Admit: 2020-04-02 | Discharge: 2020-04-02 | Disposition: A | Discharge status: Home / Self Care | Payer: 59 | Source: Ambulatory Visit

## 2020-04-02 ENCOUNTER — Emergency Department (INDEPENDENT_AMBULATORY_CARE_PROVIDER_SITE_OTHER): Payer: 59

## 2020-04-02 DIAGNOSIS — M25512 Pain in left shoulder: Secondary | ICD-10-CM | POA: Diagnosis not present

## 2020-04-02 NOTE — ED Provider Notes (Signed)
[facility]    CSN: [id number] Arrival date & time: 04/02/20  1256      History   Chief Complaint Chief Complaint  Patient presents with  . Arm Pain    LT    HPI Lisa Baxter is a 44 y.o. female.   HPI  Lisa Baxter is a 44 y.o. female presenting to UC with c/o Left shoulder and upper arm pain for about 2 weeks.  Pain is intermittent. Pt reports receiving her annual flu vaccine in same arm about 2 weeks ago through [facility] for work.  Denies immediate pain but has had mild intermittent pain since. This morning, pain was severe, mild relief with Aleve.  Pain states in deltoid and radiates down her arm. Denies neck or back pain. No known injury. She is Right hand dominant.    Past Medical History:  Diagnosis Date  . GERD (gastroesophageal reflux disease)   . Irritable bowel syndrome    with constipation  . Migraine headache   . Small intestinal bacterial overgrowth 09/27/2019    Patient Active Problem List   Diagnosis Date Noted  . Small intestinal bacterial overgrowth 09/27/2019  . Sudden onset of severe headache 09/21/2015  . Chronic migraine 06/23/2015    Past Surgical History:  Procedure Laterality Date  . ABDOMINAL HYSTERECTOMY     partial  . CHOLECYSTECTOMY    . COLONOSCOPY    . DILATION AND CURETTAGE OF UTERUS  1999  . ESOPHAGOGASTRODUODENOSCOPY    . TUBAL LIGATION  2005  . UTERINE FIBROID SURGERY      OB History   No obstetric history on file.      Home Medications    Prior to Admission medications   Medication Sig Start Date End Date Taking? Authorizing Provider  cyclobenzaprine (FLEXERIL) 10 MG tablet Take 10 mg by mouth 3 (three) times daily as needed for muscle spasms.    [provider]  fluticasone (FLONASE) 50 MCG/ACT nasal spray Place 2 sprays into both nostrils daily. 02/11/20   Couture, Cortni S, PA-C  lubiprostone (AMITIZA) 8 MCG capsule Take 1 capsule (8 mcg total) by mouth 2 (two) times daily with a meal.  01/23/20   Iva Boop, MD  ondansetron (ZOFRAN ODT) 4 MG disintegrating tablet Take 1 tablet (4 mg total) by mouth every 8 (eight) hours as needed. 03/29/19   Lurene Shadow, PA-C  polyethylene glycol powder (GLYCOLAX/MIRALAX) powder Take 17 g by mouth 2 (two) times daily as needed. Patient taking differently: Take 17 g by mouth daily.  06/01/17   Dorena Bodo, NP  rizatriptan (MAXALT-MLT) 10 MG disintegrating tablet Take 1 tablet (10 mg total) by mouth as needed for migraine. May repeat in 2 hours if needed 08/26/19   Lomax, Amy, NP  SUMAtriptan Succinate Refill 6 MG/0.5ML SOCT INJECT 0.5ML AT ONSET OF MIGAINE. MAY REPEAT IN TWO HOURS IF HEADACHE PERSIST OR RECURS. Patient taking differently: Inject 0.59ml at onset of migaine.  May repeat in two hours if headache persist or recurs. As needed 08/31/17   Butch Penny, NP    Family History Family History  Problem Relation Age of Onset  . Prostate cancer Father   . Hypertension Father   . Diabetes Father   . Hypertension Mother   . Asthma Mother   . Asthma Sister   . Diabetes Paternal Grandmother   . Asthma Sister   . Breast cancer Neg Hx   . Colon cancer Neg Hx   . Stomach cancer  Neg Hx   . Pancreatic cancer Neg Hx   . Esophageal cancer Neg Hx   . Rectal cancer Neg Hx     Social History Social History   Tobacco Use  . Smoking status: Never Smoker  . Smokeless tobacco: Never Used  Vaping Use  . Vaping Use: Never used  Substance Use Topics  . Alcohol use: Yes    Alcohol/week: 0.0 standard drinks    Comment: 2- 3 times weekly - small amount  . Drug use: No     Allergies   Patient has no known allergies.   Review of Systems Review of Systems  Musculoskeletal: Positive for arthralgias and myalgias. Negative for neck pain and neck stiffness.  Skin: Negative for color change, rash and wound.  Neurological: Negative for weakness and numbness.     Physical Exam Triage Vital Signs ED Triage Vitals  Enc Vitals  Group     BP 04/02/20 1311 (!) 146/85     Pulse Rate 04/02/20 1311 76     Resp 04/02/20 1311 18     Temp 04/02/20 1311 98.3 F (36.8 C)     Temp Source 04/02/20 1311 Oral     SpO2 04/02/20 1311 100 %     Weight --      Height --      Head Circumference --      Peak Flow --      Pain Score 04/02/20 1315 7     Pain Loc --      Pain Edu? --      Excl. in GC? --    No data found.  Updated Vital Signs BP (!) 146/85 (BP Location: Right Arm)   Pulse 76   Temp 98.3 F (36.8 C) (Oral)   Resp 18   SpO2 100%   Visual Acuity Right Eye Distance:   Left Eye Distance:   Bilateral Distance:    Right Eye Near:   Left Eye Near:    Bilateral Near:     Physical Exam Vitals and nursing note reviewed.  Constitutional:      Appearance: Normal appearance. She is well-developed.  HENT:     Head: Normocephalic and atraumatic.  Cardiovascular:     Rate and Rhythm: Normal rate and regular rhythm.     Pulses:          Radial pulses are 2+ on the left side.  Pulmonary:     Effort: Pulmonary effort is normal.  Musculoskeletal:        General: Tenderness present. Normal range of motion.     Cervical back: Normal range of motion. No rigidity or tenderness.     Comments: Left shoulder: no deformity. Full ROM, increased pain at end range, especially overhead. Tenderness to proximal deltoid. No masses palpated. No edema. Left elbow and wrist: non-tender, full ROM No spinal tenderness.   Skin:    General: Skin is warm and dry.     Capillary Refill: Capillary refill takes less than 2 seconds.     Findings: No bruising or erythema.  Neurological:     Mental Status: She is alert and oriented to person, place, and time.     Sensory: No sensory deficit.  Psychiatric:        Behavior: Behavior normal.      UC Treatments / Results  Labs (all labs ordered are listed, but only abnormal results are displayed) Labs Reviewed - No data to display  EKG   Radiology DG Shoulder Left  Result  Date: 04/02/2020 CLINICAL DATA:  Left shoulder pain for 1 week without trauma. EXAM: LEFT SHOULDER - 2+ VIEW COMPARISON:  None. FINDINGS: Visualized portion of the left hemithorax is normal. No acute fracture or dislocation. Accessory ossicle within the acromion. IMPRESSION: No acute osseous abnormality. Electronically Signed   By: Jeronimo Greaves M.D.   On: 04/02/2020 14:25    Procedures Procedures (including critical care time)  Medications Ordered in UC Medications - No data to display  Initial Impression / Assessment and Plan / UC Course  I have reviewed the triage vital signs and the nursing notes.  Pertinent labs & imaging results that were available during my care of the patient were reviewed by me and considered in my medical decision making (see chart for details).     No ecchymosis.  No evidence of underlying infection. Discussed imaging with pt Encouraged home exercises F/u with Sports Medicine next week for further evaluation and treatment if not improving AVS given  Final Clinical Impressions(s) / UC Diagnoses   Final diagnoses:  Left shoulder pain     Discharge Instructions      You may take 500mg  acetaminophen every 4-6 hours or in combination with ibuprofen 400-600mg  every 6-8 hours as needed for pain and inflammation.  You may try the range of motion shoulder excises in this packet and alternate cool and warm compresses to help with pain.  Call to schedule a follow up appointment with Sports Medicine next week if not improving.      ED Prescriptions    None     PDMP not reviewed this encounter.   , Lurene Shadow 04/02/20 [phone]

## 2020-04-02 NOTE — ED Triage Notes (Signed)
Pt c/o LT arm pain x 2 weeks, pain is intermittent. Says she received her flu shot 2 weeks ago as well. Pain starts in deltoid and radiates down into forearm. Tender to touch. Ibuprofen prn.

## 2020-04-02 NOTE — Discharge Instructions (Addendum)
  You may take 500mg  acetaminophen every 4-6 hours or in combination with ibuprofen 400-600mg  every 6-8 hours as needed for pain and inflammation.  You may try the range of motion shoulder excises in this packet and alternate cool and warm compresses to help with pain.  Call to schedule a follow up appointment with Sports Medicine next week if not improving.

## 2020-04-06 ENCOUNTER — Other Ambulatory Visit: Payer: Self-pay | Admitting: Internal Medicine

## 2020-04-06 ENCOUNTER — Ambulatory Visit (AMBULATORY_SURGERY_CENTER): Payer: Self-pay

## 2020-04-06 ENCOUNTER — Other Ambulatory Visit: Payer: Self-pay

## 2020-04-06 ENCOUNTER — Encounter: Payer: Self-pay | Admitting: Internal Medicine

## 2020-04-06 VITALS — Ht 66.0 in | Wt 144.8 lb

## 2020-04-06 DIAGNOSIS — Z1211 Encounter for screening for malignant neoplasm of colon: Secondary | ICD-10-CM

## 2020-04-06 DIAGNOSIS — K5909 Other constipation: Secondary | ICD-10-CM

## 2020-04-06 MED ORDER — NA SULFATE-K SULFATE-MG SULF 17.5-3.13-1.6 GM/177ML PO SOLN: 1.0000 | Freq: Once | ORAL | 0 refills | Status: DC

## 2020-04-06 MED FILL — SUPREP BOWEL PREP KIT: 17.5-3.13-1 | 2 days supply | Qty: 354 | Fill #0

## 2020-04-06 NOTE — Progress Notes (Signed)
Pt verified name, DOB, address and insurance during Virtual PV today.   Pt mailed instruction packet to included paper to complete and mail back to [facility] with addressed and stamped envelope,  copy of consent form to read and not return, and instructions.   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 Pt encouraged to call with questions or issues   No allergies to soy or egg Pt is not on blood thinners or diet pills Denies issues with sedation/intubation Denies atrial flutter/fib Does have chronic constipation - will be given 2 day prep.  Emmi instructions given to pt  Pt is aware of Covid safety and care partner requirements.    Wt self report --144.8 11/8

## 2020-04-14 IMAGING — DX DG ABDOMEN 2V
3 series · 3 of 3 positions shown · non-contrast
Comparison: CT 03/29/2019.

CLINICAL DATA: Back pain.  Abdominal pain.

EXAM:
ABDOMEN - 2 VIEW

[abdomen erect]
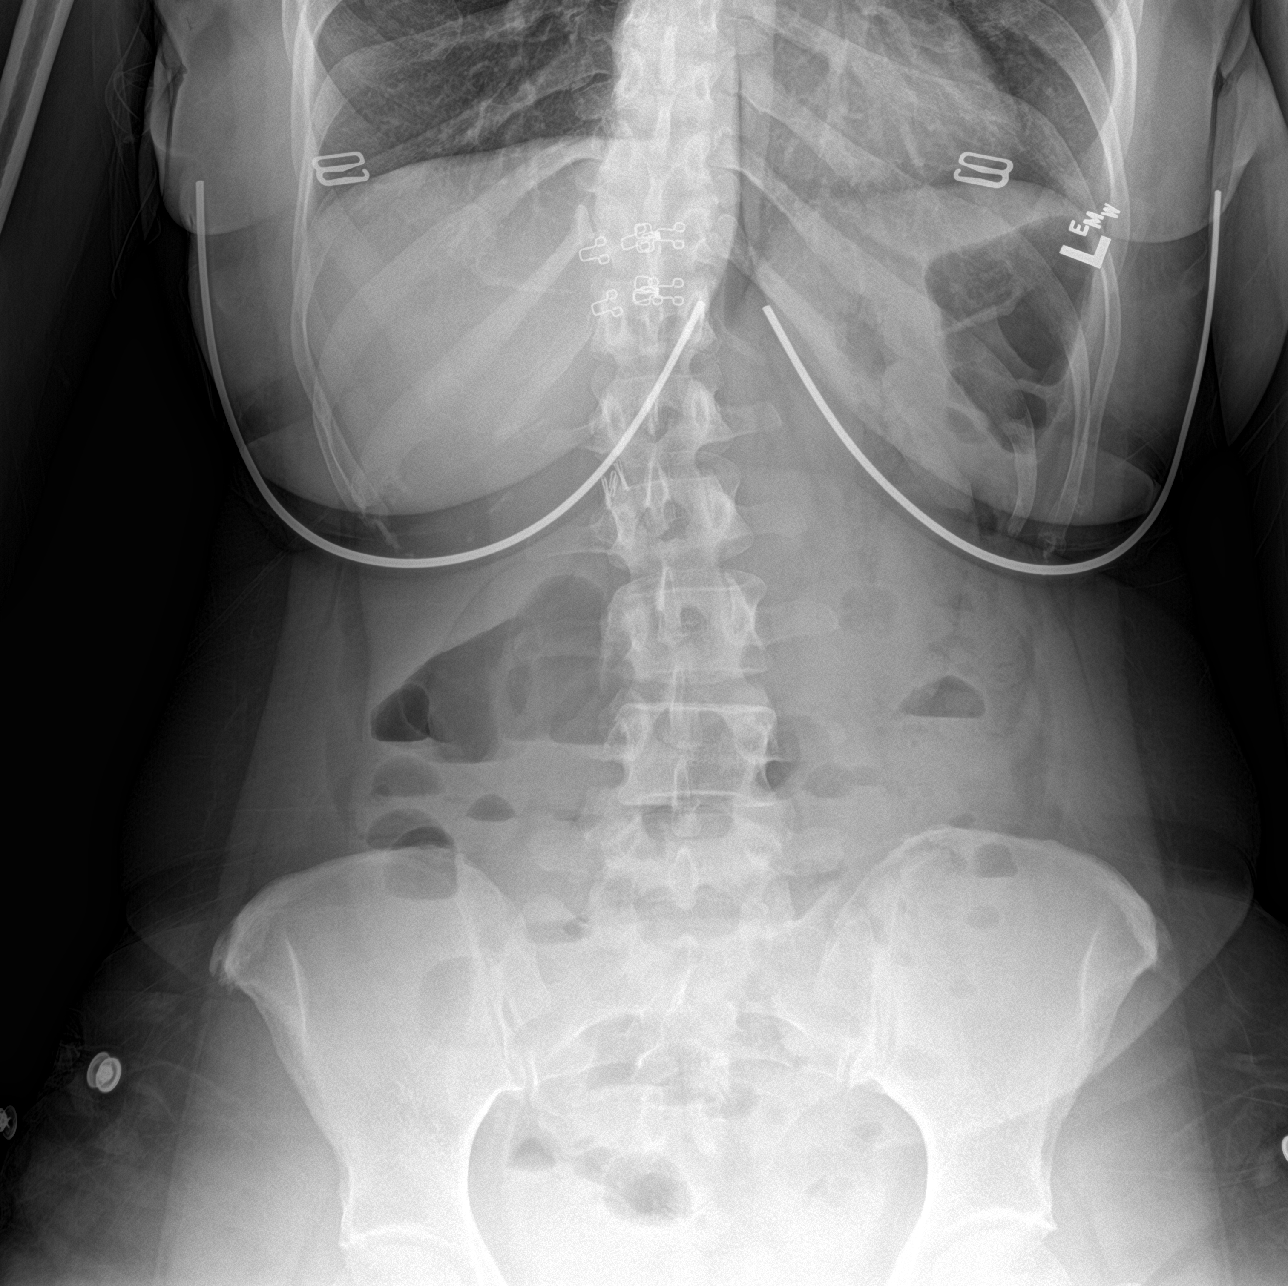

[abdomen supine (1 of 2)]
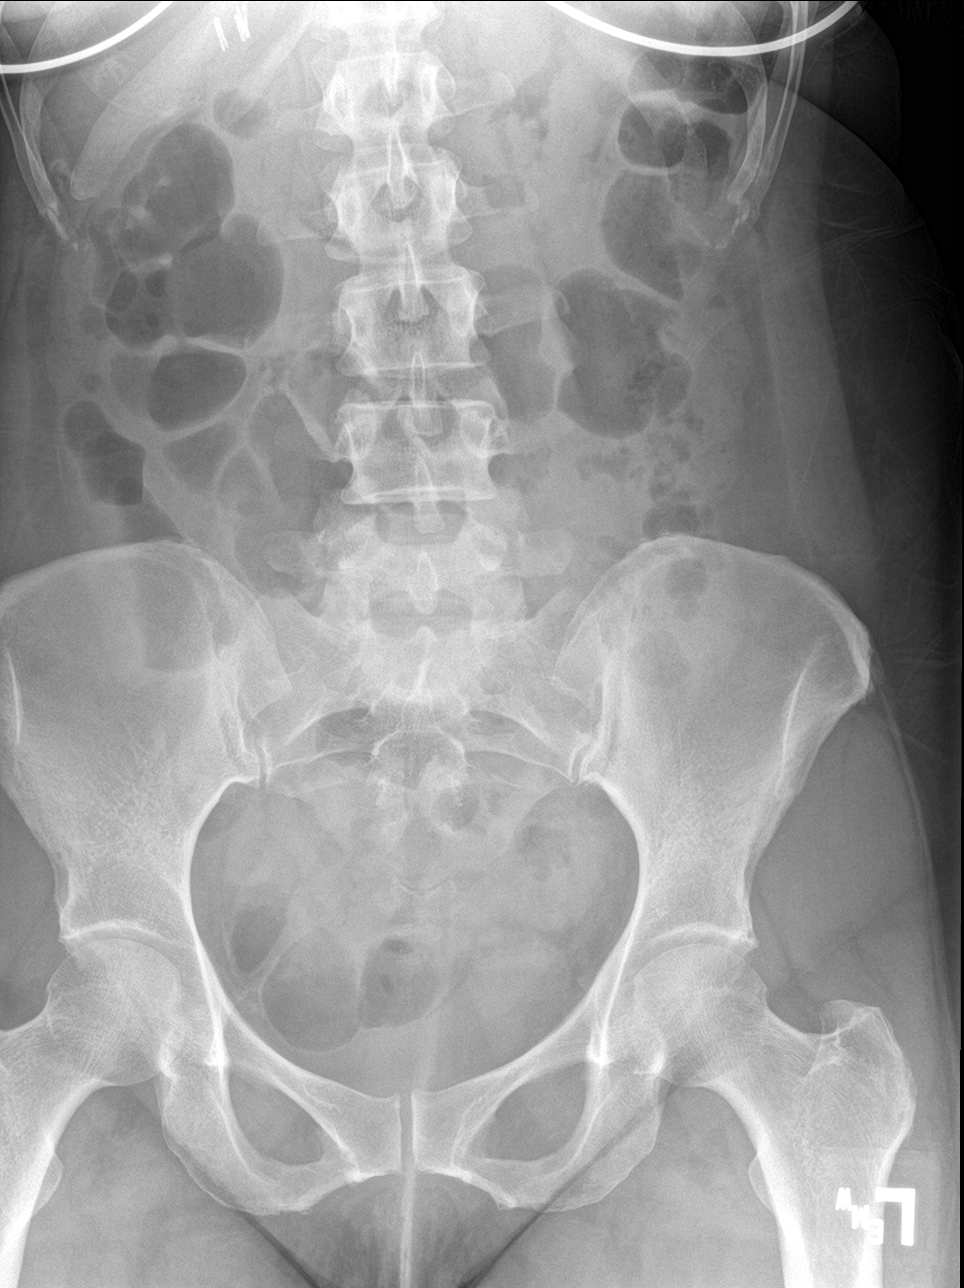

[abdomen supine (2 of 2)]
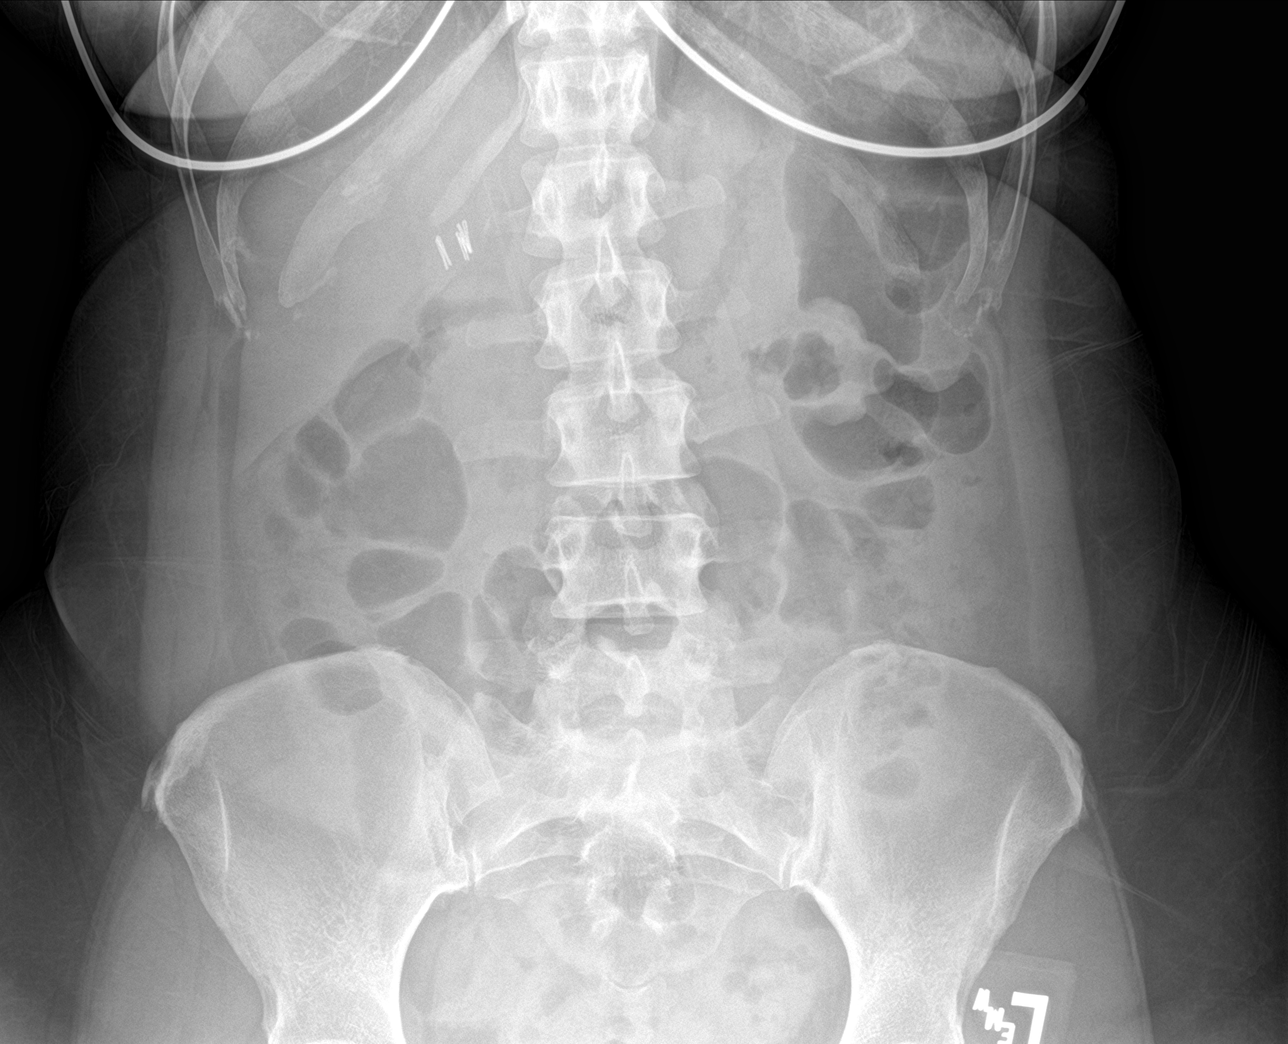

[3 of 3 positions shown; findings below may reference images not displayed]

FINDINGS: Soft tissue structures are unremarkable. Scattered air-fluid levels
noted throughout the colon, and possibly small bowel. No prominent
bowel distention. A diarrheal illness could present this fashion.
Follow-up exam can be obtained to exclude developing bowel
obstruction. No free air. Mild thoracolumbar spine scoliosis.
Surgical clips right upper quadrant. No acute bony abnormality.
IMPRESSION: Scattered air-fluid levels noted throughout the colon and possibly
small bowel. No prominent bowel distention noted. A diarrheal
illness could present this fashion. Follow-up exam can be obtained
to exclude developing bowel obstruction. No free air.

## 2020-04-16 ENCOUNTER — Encounter: Payer: Self-pay | Admitting: Internal Medicine

## 2020-04-16 ENCOUNTER — Ambulatory Visit (AMBULATORY_SURGERY_CENTER): Payer: 59 | Admitting: Internal Medicine

## 2020-04-16 ENCOUNTER — Other Ambulatory Visit: Payer: Self-pay

## 2020-04-16 VITALS — BP 128/70 | HR 60 | Temp 96.8°F | Resp 18 | Ht 65.0 in | Wt 144.8 lb

## 2020-04-16 DIAGNOSIS — Z1211 Encounter for screening for malignant neoplasm of colon: Secondary | ICD-10-CM | POA: Diagnosis not present

## 2020-04-16 DIAGNOSIS — K5909 Other constipation: Secondary | ICD-10-CM

## 2020-04-16 MED ORDER — SODIUM CHLORIDE 0.9 % IV SOLN: 500.0000 mL | Freq: Once | INTRAVENOUS | Status: DC

## 2020-04-16 NOTE — Patient Instructions (Addendum)
No polyps or cancer seen - NORMAL!  Next routine colonoscopy or other screening test in 10 years - 2031  I appreciate the opportunity to care for you. Iva Boop, MD, FACG  YOU HAD AN ENDOSCOPIC PROCEDURE TODAY AT THE [facility]:   Refer to the procedure report that was given to you for any specific questions about what was found during the examination.  If the procedure report does not answer your questions, please call your gastroenterologist to clarify.  If you requested that your care partner not be given the details of your procedure findings, then the procedure report has been included in a sealed envelope for you to review at your convenience later.  YOU SHOULD EXPECT: Some feelings of bloating in the abdomen. Passage of more gas than usual.  Walking can help get rid of the air that was put into your GI tract during the procedure and reduce the bloating. If you had a lower endoscopy (such as a colonoscopy or flexible sigmoidoscopy) you may notice spotting of blood in your stool or on the toilet paper. If you underwent a bowel prep for your procedure, you may not have a normal bowel movement for a few days.  Please Note:  You might notice some irritation and congestion in your nose or some drainage.  This is from the oxygen used during your procedure.  There is no need for concern and it should clear up in a day or so.  SYMPTOMS TO REPORT IMMEDIATELY:   Following lower endoscopy (colonoscopy or flexible sigmoidoscopy):  Excessive amounts of blood in the stool  Significant tenderness or worsening of abdominal pains  Swelling of the abdomen that is new, acute  Fever of 100F or higher   For urgent or emergent issues, a gastroenterologist can be reached at any hour by calling [phone]. Do not use MyChart messaging for urgent concerns.    DIET:  We do recommend a small meal at first, but then you may proceed to your regular diet.  Drink plenty of fluids but you  should avoid alcoholic beverages for 24 hours.  ACTIVITY:  You should plan to take it easy for the rest of today and you should NOT DRIVE or use heavy machinery until tomorrow (because of the sedation medicines used during the test).    FOLLOW UP: Our staff will call the number listed on your records 48-72 hours following your procedure to check on you and address any questions or concerns that you may have regarding the information given to you following your procedure. If we do not reach you, we will leave a message.  We will attempt to reach you two times.  During this call, we will ask if you have developed any symptoms of COVID 19. If you develop any symptoms (ie: fever, flu-like symptoms, shortness of breath, cough etc.) before then, please call [phone].  If you test positive for Covid 19 in the 2 weeks post procedure, please call and report this information to [place].    If any biopsies were taken you will be contacted by phone or by letter within the next 1-3 weeks.  Please call us at [phone] if you have not heard about the biopsies in 3 weeks.    SIGNATURES/CONFIDENTIALITY: You and/or your care partner have signed paperwork which will be entered into your electronic medical record.  These signatures attest to the fact that that the information above on your After Visit Summary has been reviewed and is  understood.  Full responsibility of the confidentiality of this discharge information lies with you and/or your care-partner. 

## 2020-04-16 NOTE — Op Note (Signed)
[facility] Patient Name: Lisa Baxter Procedure Date: 04/16/2020 10:22 AM MRN: [id number] Endoscopist: Iva Boop , MD Age: 44 Referring MD:  Date of Birth: September 11, 1975 Gender: Female Account #: [id number] Procedure:                Colonoscopy Indications:              Screening for colorectal malignant neoplasm Medicines:                Propofol per Anesthesia, Monitored Anesthesia Care Procedure:                Pre-Anesthesia Assessment:                           - Prior to the procedure, a History and Physical                            was performed, and patient medications and                            allergies were reviewed. The patient's tolerance of                            previous anesthesia was also reviewed. The risks                            and benefits of the procedure and the sedation                            options and risks were discussed with the patient.                            All questions were answered, and informed consent                            was obtained. Prior Anticoagulants: The patient has                            taken no previous anticoagulant or antiplatelet                            agents. ASA Grade Assessment: II - A patient with                            mild systemic disease. After reviewing the risks                            and benefits, the patient was deemed in                            satisfactory condition to undergo the procedure.                           After obtaining informed consent, the colonoscope  was passed under direct vision. Throughout the                            procedure, the patient's blood pressure, pulse, and                            oxygen saturations were monitored continuously. The                            Colonoscope was introduced through the anus and                            advanced to the the cecum, identified by                             appendiceal orifice and ileocecal valve. The                            ileocecal valve, appendiceal orifice, and rectum                            were photographed. The quality of the bowel                            preparation was excellent. The colonoscopy was                            performed without difficulty. The patient tolerated                            the procedure well. The bowel preparation used was                            SUPREP via split dose instruction. Scope In: 10:41:57 AM Scope Out: 10:56:00 AM Scope Withdrawal Time: 0 hours 10 minutes 17 seconds  Total Procedure Duration: 0 hours 14 minutes 3 seconds  Findings:                 The perianal and digital rectal examinations were                            normal.                           The colon (entire examined portion) appeared normal.                           No additional abnormalities were found on                            retroflexion. Complications:            No immediate complications. Estimated blood loss:                            None. Estimated Blood Loss:  Estimated blood loss: none. Impression:               - The entire examined colon is normal.                           - No specimens collected. Recommendation:           - Repeat colonoscopy in 10 years for screening                            purposes.                           - Patient has a contact number available for                            emergencies. The signs and symptoms of potential                            delayed complications were discussed with the                            patient. Return to normal activities tomorrow.                            Written discharge instructions were provided to the                            patient.                           - Resume previous diet.                           - Continue present medications. Iva Boop, MD 04/16/2020 11:06:14 AM This report has been signed  electronically.

## 2020-04-16 NOTE — Progress Notes (Signed)
A/ox3, pleased with MAC, report to RN 

## 2020-04-16 NOTE — Progress Notes (Signed)
VS by [place]  Pt's states no medical or surgical changes since previsit or office visit.  

## 2020-04-20 ENCOUNTER — Other Ambulatory Visit (HOSPITAL_COMMUNITY): Payer: Self-pay | Admitting: Family

## 2020-04-20 ENCOUNTER — Telehealth: Payer: Self-pay | Admitting: *Deleted

## 2020-04-20 ENCOUNTER — Telehealth: Payer: 59 | Admitting: Family

## 2020-04-20 DIAGNOSIS — L309 Dermatitis, unspecified: Secondary | ICD-10-CM

## 2020-04-20 MED ORDER — PREDNISONE 10 MG (21) PO TBPK: ORAL_TABLET | ORAL | 0 refills | Status: DC

## 2020-04-20 MED FILL — predniSONE 10 MG TABS: 10 | 6 days supply | Qty: 21 | Fill #0

## 2020-04-20 NOTE — Progress Notes (Signed)
E Visit for Rash  We are sorry that you are not feeling well. Here is how we plan to help!  Based on what you shared with me it looks like you have contact dermatitis.  Contact dermatitis is a skin rash caused by something that touches the skin and causes irritation or inflammation.  Your skin may be red, swollen, dry, cracked, and itch.  The rash should go away in a few days but can last a few weeks.  If you get a rash, it's important to figure out what caused it so the irritant can be avoided in the future. and I am prescribing a two week course of steroids (37 tablets of 10 mg prednisone).  Days 1-4 take 4 tablets (40 mg) daily  Days 5-8 take 3 tablets (30 mg) daily, Days 9-11 take 2 tablets (20 mg) daily, Days 12-14 take 1 tablet (10 mg) daily.    HOME CARE:   Take cool showers and avoid direct sunlight.  Apply cool compress or wet dressings.  Take a bath in an oatmeal bath.  Sprinkle content of one Aveeno packet under running faucet with comfortably warm water.  Bathe for 15-20 minutes, 1-2 times daily.  Pat dry with a towel. Do not rub the rash.  Use hydrocortisone cream.  Take an antihistamine like Benadryl for widespread rashes that itch.  The adult dose of Benadryl is 25-50 mg by mouth 4 times daily.  Caution:  This type of medication may cause sleepiness.  Do not drink alcohol, drive, or operate dangerous machinery while taking antihistamines.  Do not take these medications if you have prostate enlargement.  Read package instructions thoroughly on all medications that you take.  GET HELP RIGHT AWAY IF:   Symptoms don't go away after treatment.  Severe itching that persists.  If you rash spreads or swells.  If you rash begins to smell.  If it blisters and opens or develops a yellow-brown crust.  You develop a fever.  You have a sore throat.  You become short of breath.  MAKE SURE YOU:  Understand these instructions. Will watch your condition. Will get help right  away if you are not doing well or get worse.  Thank you for choosing an e-visit. Your e-visit answers were reviewed by a board certified advanced clinical practitioner to complete your personal care plan. Depending upon the condition, your plan could have included both over the counter or prescription medications. Please review your pharmacy choice. Be sure that the pharmacy you have chosen is open so that you can pick up your prescription now.  If there is a problem you may message your provider in MyChart to have the prescription routed to another pharmacy. Your safety is important to [place]. If you have drug allergies check your prescription carefully.  For the next 24 hours, you can use MyChart to ask questions about today's visit, request a non-urgent call back, or ask for a work or school excuse from your e-visit provider. You will get an email in the next two days asking about your experience. I hope that your e-visit has been valuable and will speed your recovery.  Greater than 5 minutes, yet less than 10 minutes of time have been spent researching, coordinating, and implementing care for this patient today.  Thank you for the details you included in the comment boxes. Those details are very helpful in determining the best course of treatment for you and help [place] to provide the best care.

## 2020-04-20 NOTE — Telephone Encounter (Signed)
  Follow up Call-  Call back number 04/16/2020  Post procedure Call Back phone  # [phone]  Permission to leave phone message Yes  Some recent data might be hidden     Patient questions:  Do you have a fever, pain , or abdominal swelling? No. Pain Score  0 *  Have you tolerated food without any problems? Yes.    Have you been able to return to your normal activities? Yes.    Do you have any questions about your discharge instructions: Diet   No. Medications  No. Follow up visit  No.  Do you have questions or concerns about your Care? No.  Actions: * If pain score is 4 or above: 1. No action needed, pain <4.Have you developed a fever since your procedure? no  2.   Have you had an respiratory symptoms (SOB or cough) since your procedure? no  3.   Have you tested positive for COVID 19 since your procedure no  4.   Have you had any family members/close contacts diagnosed with the COVID 19 since your procedure?  no   If yes to any of these questions please route to Laverna Peace, RN and Karlton Lemon, RN

## 2020-06-17 ENCOUNTER — Telehealth: Payer: Self-pay

## 2020-06-17 NOTE — Telephone Encounter (Signed)
I have started a prior authorization thru [employer] , phone # [phone] for patient's lubiprostone , one BID for her chronic constipation K59.09 and her IBS with constipation K58.1. I answered her questions and she said we will know an outcome in 48-72 hours.

## 2020-06-17 NOTE — Telephone Encounter (Signed)
Received a fax with approval for patient's Lubiprostone 8 mcg good from 06/17/2020-06/16/2021. [facility] pharmacy informed.

## 2020-06-18 MED FILL — LUBIPROSTONE 8 MCG CAPS: 8 | 90 days supply | Qty: 180 | Fill #1

## 2020-06-25 DIAGNOSIS — H5203 Hypermetropia, bilateral: Secondary | ICD-10-CM | POA: Diagnosis not present

## 2020-06-25 DIAGNOSIS — H52223 Regular astigmatism, bilateral: Secondary | ICD-10-CM | POA: Diagnosis not present

## 2020-06-30 ENCOUNTER — Other Ambulatory Visit: Payer: Self-pay | Admitting: Obstetrics and Gynecology

## 2020-06-30 DIAGNOSIS — Z1231 Encounter for screening mammogram for malignant neoplasm of breast: Secondary | ICD-10-CM

## 2020-07-02 DIAGNOSIS — Z01419 Encounter for gynecological examination (general) (routine) without abnormal findings: Secondary | ICD-10-CM | POA: Diagnosis not present

## 2020-08-14 ENCOUNTER — Ambulatory Visit: Payer: 59

## 2020-08-18 ENCOUNTER — Other Ambulatory Visit (HOSPITAL_BASED_OUTPATIENT_CLINIC_OR_DEPARTMENT_OTHER): Payer: Self-pay

## 2020-09-14 DIAGNOSIS — Z20822 Contact with and (suspected) exposure to covid-19: Secondary | ICD-10-CM | POA: Diagnosis not present

## 2020-10-02 ENCOUNTER — Ambulatory Visit
Admission: RE | Admit: 2020-10-02 | Discharge: 2020-10-02 | Disposition: A | Discharge status: Home / Self Care | Payer: 59 | Source: Ambulatory Visit | Attending: Obstetrics and Gynecology | Admitting: Obstetrics and Gynecology

## 2020-10-02 DIAGNOSIS — Z1231 Encounter for screening mammogram for malignant neoplasm of breast: Secondary | ICD-10-CM

## 2020-11-02 MED FILL — Lubiprostone Cap 8 MCG: ORAL | 90 days supply | Qty: 180 | Fill #0 | Status: AC

## 2020-11-03 ENCOUNTER — Other Ambulatory Visit (HOSPITAL_COMMUNITY): Payer: Self-pay

## 2020-11-04 ENCOUNTER — Other Ambulatory Visit (HOSPITAL_COMMUNITY): Payer: Self-pay

## 2020-11-13 ENCOUNTER — Ambulatory Visit (INDEPENDENT_AMBULATORY_CARE_PROVIDER_SITE_OTHER): Payer: 59 | Admitting: Internal Medicine

## 2020-11-13 ENCOUNTER — Other Ambulatory Visit (HOSPITAL_COMMUNITY): Payer: Self-pay

## 2020-11-13 ENCOUNTER — Encounter: Payer: Self-pay | Admitting: Internal Medicine

## 2020-11-13 VITALS — BP 110/66 | HR 69 | Ht 65.0 in | Wt 145.6 lb

## 2020-11-13 DIAGNOSIS — K581 Irritable bowel syndrome with constipation: Secondary | ICD-10-CM | POA: Insufficient documentation

## 2020-11-13 DIAGNOSIS — K6389 Other specified diseases of intestine: Secondary | ICD-10-CM | POA: Diagnosis not present

## 2020-11-13 MED ORDER — DICYCLOMINE HCL 20 MG PO TABS
20.0000 mg | ORAL_TABLET | Freq: Four times a day (QID) | ORAL | 0 refills | Status: DC | PRN
  Filled 2020-11-13 – 2020-12-18 (×2): qty 90, 23d supply, fill #0

## 2020-11-13 NOTE — Progress Notes (Signed)
Lisa Baxter 45 y.o. 01-06-1976 [id number]  Assessment & Plan:   Encounter Diagnoses  Name Primary?   Small intestinal bacterial overgrowth Yes   Irritable bowel syndrome with constipation     ? If Sxs now are recurrent/persistent SIBO  Plan to repeat SIBO test Retx if still +  Dietitian resources provided FindScifi.com.ee   https://[email or web address]/   https://www.brown.net/   http://www.[email or web address].com/  Meds ordered this encounter  Medications   dicyclomine (BENTYL) 20 MG tablet    Sig: Take 1 tablet (20 mg total) by mouth every 6 (six) hours as needed for spasms.    Dispense:  90 tablet    Refill:  0   Pelvic floor PT  Hold sitzmarks in reserve - difficult to stop Miralax at this time and not sure how much a difference that will make in Tx  Rec stop apple cider gummies as not going to help - agree w/ ACV but need to drink  01/21/2021 is next appt w/ me   Subjective:   Chief Complaint: abdominal pain  HPI 45 yo woman w/ SIBO and constipation problems - has had Xifaxan 2 x w/ help. Now over past 1-2 weeks has had some lower abd pains again. Borborygmi is persistent If stops Miralax may help that but cannot tolerate due to constipation. Some increase in flatus and it is sometimes malodorous  Leaving [employer] soon - to work for [employer]  No Known Allergies Current Meds  Medication Sig   APPLE CIDER VINEGAR PO Take 1 tablet by mouth daily.   CALCIUM PO Take 1 tablet by mouth daily.   Cyanocobalamin (VITAMIN B-12 PO) Take 1 tablet by mouth daily.   cyclobenzaprine (FLEXERIL) 10 MG tablet Take 10 mg by mouth 3 (three) times daily as needed for muscle spasms.   fluticasone (FLONASE) 50 MCG/ACT nasal spray Place 2 sprays into both nostrils daily.   lubiprostone (AMITIZA) 8 MCG capsule TAKE 1 CAPSULE BY MOUTH TWICE DAILY WITH A MEAL   ondansetron (ZOFRAN ODT) 4 MG disintegrating tablet Take 1 tablet (4 mg total) by  mouth every 8 (eight) hours as needed.   pantoprazole (PROTONIX) 40 MG tablet Take 40 mg by mouth daily.   polyethylene glycol powder (GLYCOLAX/MIRALAX) powder Take 17 g by mouth 2 (two) times daily as needed. (Patient taking differently: Take 17 g by mouth daily.)   rizatriptan (MAXALT-MLT) 10 MG disintegrating tablet Take 1 tablet (10 mg total) by mouth as needed for migraine. May repeat in 2 hours if needed   SUMAtriptan Succinate Refill 6 MG/0.5ML SOCT INJECT 0.5ML AT ONSET OF MIGAINE. MAY REPEAT IN TWO HOURS IF HEADACHE PERSIST OR RECURS. (Patient taking differently: Inject 0.68ml at onset of migaine.  May repeat in two hours if headache persist or recurs. As needed)   Past Medical History:  Diagnosis Date   GERD (gastroesophageal reflux disease)    Irritable bowel syndrome    with constipation   Migraine headache    Sickle cell trait (HCC)    Small intestinal bacterial overgrowth 09/27/2019   SVT (supraventricular tachycardia) (HCC)    years ago now stable.   Past Surgical History:  Procedure Laterality Date   ABDOMINAL HYSTERECTOMY     partial   CHOLECYSTECTOMY     COLONOSCOPY     >10 yrs   DILATION AND CURETTAGE OF UTERUS  1999   ESOPHAGOGASTRODUODENOSCOPY     TUBAL LIGATION  2005   UTERINE FIBROID SURGERY     Social History   Social History  Narrative   Lives at home with her husband and two children. 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   Drinks small amount of caffeine 2-3 times weekly.   family history includes Asthma in her mother, sister, and sister; Diabetes in her father and paternal grandmother; Hypertension in her father and mother; Prostate cancer in her father.   Review of Systems As above  Objective:   Physical Exam BP 110/66   Pulse 69   Ht 5\' 5"  (1.651 m)   Wt 145 lb 9.6 oz (66 kg)   SpO2 95%   BMI 24.23 kg/m  Abd soft and  NT, BS +

## 2020-11-13 NOTE — Patient Instructions (Addendum)
[email or web address]   [email or web address]   [email or web address]/   [email or web address]  Check out the above web sites for healthy eating information.  We are referring you for pelvic floor physical therapy. They will contact you.  We have sent the following medications to your pharmacy for you to pick up at your convenience: Dicyclomine  You have been given a testing kit to check for small intestine bacterial overgrowth (SIBO) which is completed by a company named [employer]. Make sure to return your test in the mail using the return mailing label given to you along with the kit. Your demographic and insurance information have already been sent to the company and they should be in contact with you over the next week regarding this test. [facility] will collect an upfront charge of $99.74 for commercial insurance plans and $209.74 is you are paying cash. Make sure to discuss with [facility] PRIOR to having the test if they have gotten informatoin from your insurance company as to how much your testing will cost out of pocket, if any. Please keep in mind that you will be getting a call from phone number [phone] or a similar number. If you do not hear from them within this time frame, please call our office at [phone].    I appreciate the opportunity to care for you. Silvano Rusk, MD, [facility]

## 2020-11-24 ENCOUNTER — Other Ambulatory Visit (HOSPITAL_COMMUNITY): Payer: Self-pay

## 2020-12-18 ENCOUNTER — Other Ambulatory Visit (HOSPITAL_COMMUNITY): Payer: Self-pay

## 2020-12-24 ENCOUNTER — Other Ambulatory Visit (HOSPITAL_COMMUNITY): Payer: Self-pay

## 2020-12-28 ENCOUNTER — Other Ambulatory Visit (HOSPITAL_COMMUNITY): Payer: Self-pay

## 2021-01-18 ENCOUNTER — Ambulatory Visit: Payer: BC Managed Care – PPO | Attending: Internal Medicine | Admitting: Physical Therapy

## 2021-01-18 ENCOUNTER — Other Ambulatory Visit: Payer: Self-pay

## 2021-01-18 ENCOUNTER — Encounter: Payer: Self-pay | Admitting: Physical Therapy

## 2021-01-18 DIAGNOSIS — R252 Cramp and spasm: Secondary | ICD-10-CM

## 2021-01-18 DIAGNOSIS — M6281 Muscle weakness (generalized): Secondary | ICD-10-CM

## 2021-01-18 NOTE — Patient Instructions (Signed)
Access Code: [email or web address] URL: [email or web address] Date: 01/18/2021 Prepared by: Dwana Curd  Exercises Standing Hamstring Stretch with Step - 1 x daily - 7 x weekly - 1 sets - 3 reps - 30 sec hold Standing Gastroc Stretch - 1 x daily - 7 x weekly - 1 sets - 3 reps - 30 sec hold Supine Pelvic Floor Stretch - 1 x daily - 7 x weekly - 1 sets - 3 reps - 30 sec hold  Patient Education Trigger Point Dry Needling

## 2021-01-18 NOTE — Therapy (Signed)
[facility] [address], [address] [place], [place], [address] Phone: [phone]   Fax:  [phone]  Physical Therapy Evaluation  Patient Details  Name: Lisa Baxter MRN: [id number] Date of Birth: 1975-10-04 Referring Provider (PT): Iva Boop, MD   Encounter Date: 01/18/2021   PT End of Session - 01/18/21 0829     Visit Number 1    Date for PT Re-Evaluation 04/12/21    Authorization Type [employer] employee    PT Start Time 0805    PT Stop Time 0840    PT Time Calculation (min) 35 min    Activity Tolerance Patient tolerated treatment well    Behavior During Therapy [facility] for tasks assessed/performed             Past Medical History:  Diagnosis Date   GERD (gastroesophageal reflux disease)    Irritable bowel syndrome    with constipation   Migraine headache    Sickle cell trait (HCC)    Small intestinal bacterial overgrowth 09/27/2019   SVT (supraventricular tachycardia) (HCC)    years ago now stable.    Past Surgical History:  Procedure Laterality Date   ABDOMINAL HYSTERECTOMY     partial   CHOLECYSTECTOMY     COLONOSCOPY     >10 yrs   DILATION AND CURETTAGE OF UTERUS  1999   ESOPHAGOGASTRODUODENOSCOPY     TUBAL LIGATION  2005   UTERINE FIBROID SURGERY      There were no vitals filed for this visit.    Subjective Assessment - 01/18/21 0810     Subjective Pt presents to skilled PT due to constipation.  A normal week is every other day and sometimes just doesn't feel completely emptying    Patient Stated Goals be able to have a BM and feel she is emptying completely    Currently in Pain? Yes    Pain Score 6     Pain Location Abdomen    Pain Orientation Right;Left;Mid;Lower    Pain Descriptors / Indicators Sharp;Throbbing    Pain Type Chronic pain    Pain Onset More than a month ago   3 months   Pain Frequency Intermittent    Aggravating Factors  happens when not having a BM for long  periods of time    Pain Relieving Factors having a BM    Multiple Pain Sites No                [facility] PT Assessment - 01/18/21 0001       Assessment   Medical Diagnosis K58.1 (ICD-10-CM) - Irritable bowel syndrome with constipation    Referring Provider (PT) Iva Boop, MD    Onset Date/Surgical Date --   several years to whole life     Balance Screen   Has the patient fallen in the past 6 months No      Home Environment   Living Environment Private residence    Living Arrangements Spouse/significant other;Children      Prior Function   Level of Independence Independent    Vocation Full time employment    Biochemist, clinical sitting    Leisure TV and exercise, walk and jog      Cognition   Overall Cognitive Status Within Functional Limits for tasks assessed                        Objective measurements completed on examination: See above findings.  Pelvic Floor Special Questions - 01/18/21 0001     Prior Pelvic/Prostate Exam No    Are you Pregnant or attempting pregnancy? No    Prior Pregnancies Yes    Number of Pregnancies 2    Number of Vaginal Deliveries 2    Currently Sexually Active Yes    Is this Painful No    Urinary Leakage No    Urinary urgency No    Urinary frequency nocturia 1-2x per night; every 30 minutes    Fluid intake 64 oz    Falling out feeling (prolapse) No    Pelvic Floor Internal Exam pt identity confirmed and consent given to perform    Exam Type Rectal    Palpation bulges but little force to expel finger, high tone and tight anal sphincters, TTP of sphincters when attempting to palpate levators    Strength weak squeeze, no lift    Strength # of reps 1   6 quick with decreased resting tone   Strength # of seconds 10    Tone high             No emotional/communication barriers or cognitive limitation. Patient is motivated to learn. Patient understands and agrees with treatment goals and  plan. PT explains patient will be examined in standing, sitting, and lying down to see how their muscles and joints work. When they are ready, they will be asked to remove their underwear so PT can examine their perineum. The patient is also given the option of providing their own chaperone as one is not provided in our facility. The patient also has the right and is explained the right to defer or refuse any part of the evaluation or treatment including the internal exam. With the patient's consent, PT will use one gloved finger to gently assess the muscles of the pelvic floor, seeing how well it contracts and relaxes and if there is muscle symmetry. After, the patient will get dressed and PT and patient will discuss exam findings and plan of care. PT and patient discuss plan of care, schedule, attendance policy and HEP activities.         PT Education - 01/18/21 0845     Education Details Access Code: [email or web address]    Person(s) Educated Patient    Methods Explanation;Demonstration;Tactile cues;Verbal cues;Handout    Comprehension Returned demonstration;Verbalized understanding              PT Short Term Goals - 01/18/21 0914       PT SHORT TERM GOAL #1   Title pt will be ind with intitial HEP    Time 4    Period Weeks    Status New    Target Date 02/15/21      PT SHORT TERM GOAL #2   Title Pt will be ind with toileting techniques    Time 4    Period Weeks    Status New    Target Date 02/15/21               PT Long Term Goals - 01/18/21 0917       PT LONG TERM GOAL #1   Title Pt will be ind with advanced HEP    Time 12    Period Weeks    Status New    Target Date 04/12/21      PT LONG TERM GOAL #2   Title Pt will report no straining with BMs to prevent risk of prolapse    Time 12  Period Weeks    Status New    Target Date 04/12/21      PT LONG TERM GOAL #3   Title Pt will report complete BM at least 2 days/week    Time 12    Period Weeks    Status New     Target Date 04/12/21      PT LONG TERM GOAL #4   Title Pt will report at least 75% less abdominal pain in intensity and frequency    Time 12    Period Weeks    Status New    Target Date 04/12/21                    Plan - 01/18/21 2035     Clinical Impression Statement Pt presents to clinic due to constipation that has been chronic. Pt has weakness in hip abduction and tight rectus abdominus.  She fatigues quickly with abdominal MMT but no diastasis present.  Pt has tension in hamstrings Rt>Lt.  Pt has slightly reduced AROM lumbar flexion and tight lumbar and thoracic paraspinals. Pt was assessed rectally for pelvic floor exam and demonstrates tight sphincter muscles with tenderness to light to medium palpation.  Pt able to do 6 quick flicks with incomplete relaxation.  She can hold 2/5 MMT for 10 seconds but not steady. Pt will benefit from skilled PT to address impairments mention above and improve ability to have BMs without straining.    Personal Factors and Comorbidities Comorbidity 2;Time since onset of injury/illness/exacerbation    Comorbidities SIBO, IBS-C    Examination-Activity Limitations Toileting    Stability/Clinical Decision Making Stable/Uncomplicated    Clinical Decision Making Moderate    Rehab Potential Excellent    PT Frequency 1x / week    PT Duration 12 weeks    PT Treatment/Interventions ADLs/Self Care Home Management;Biofeedback;Cryotherapy;Electrical Stimulation;Moist Heat;Neuromuscular re-education;Therapeutic exercise;Therapeutic activities;Patient/family education;Dry needling;Manual techniques    PT Next Visit Plan biofeedback and stretches with toileting technique education for first f/u; also try abdominal fascial release and dry needling at subsequent f/u when time allows    PT Home Exercise Plan Access Code: [email or web address]    Consulted and Agree with Plan of Care Patient             Patient will benefit from skilled therapeutic intervention in  order to improve the following deficits and impairments:  Pain, Postural dysfunction, Decreased strength, Increased fascial restricitons, Decreased range of motion, Increased muscle spasms, Impaired flexibility  Visit Diagnosis: Muscle weakness (generalized)  Cramp and spasm     Problem List Patient Active Problem List   Diagnosis Date Noted   Irritable bowel syndrome with constipation 11/13/2020   Small intestinal bacterial overgrowth 09/27/2019   Chronic migraine 06/23/2015    Junious Silk, PT 01/18/2021, 9:24 AM   [facility] [address], [address] [place], [place], [address] Phone: [phone]   Fax:  [phone]  Name: Lisa Baxter MRN: [id number] Date of Birth: Oct 02, 1975

## 2021-01-21 ENCOUNTER — Ambulatory Visit: Payer: 59 | Admitting: Internal Medicine

## 2021-01-25 ENCOUNTER — Encounter: Payer: Self-pay | Admitting: Physical Therapy

## 2021-01-29 ENCOUNTER — Other Ambulatory Visit (HOSPITAL_COMMUNITY): Payer: Self-pay

## 2021-01-29 DIAGNOSIS — J019 Acute sinusitis, unspecified: Secondary | ICD-10-CM | POA: Diagnosis not present

## 2021-01-29 MED ORDER — IPRATROPIUM BROMIDE 0.03 % NA SOLN
NASAL | 0 refills | Status: DC
  Filled 2021-01-29: qty 30, 28d supply, fill #0

## 2021-02-09 ENCOUNTER — Encounter: Payer: Self-pay | Admitting: Physical Therapy

## 2021-02-16 ENCOUNTER — Encounter: Payer: Self-pay | Admitting: Physical Therapy

## 2021-02-24 ENCOUNTER — Encounter: Payer: Self-pay | Admitting: Physical Therapy

## 2021-03-02 ENCOUNTER — Encounter: Payer: Self-pay | Admitting: Physical Therapy

## 2021-03-17 ENCOUNTER — Encounter: Payer: Self-pay | Admitting: Physical Therapy

## 2021-03-26 ENCOUNTER — Encounter: Payer: Self-pay | Admitting: Internal Medicine

## 2021-03-26 ENCOUNTER — Ambulatory Visit (INDEPENDENT_AMBULATORY_CARE_PROVIDER_SITE_OTHER): Payer: BC Managed Care – PPO | Admitting: Internal Medicine

## 2021-03-26 VITALS — BP 116/70 | HR 68 | Ht 64.0 in | Wt 149.0 lb

## 2021-03-26 DIAGNOSIS — K581 Irritable bowel syndrome with constipation: Secondary | ICD-10-CM

## 2021-03-26 DIAGNOSIS — R1033 Periumbilical pain: Secondary | ICD-10-CM

## 2021-03-26 DIAGNOSIS — K6389 Other specified diseases of intestine: Secondary | ICD-10-CM

## 2021-03-26 DIAGNOSIS — R140 Abdominal distension (gaseous): Secondary | ICD-10-CM

## 2021-03-26 NOTE — Patient Instructions (Signed)
If you are age 47 or older, your body mass index should be between 23-30. Your Body mass index is 25.58 kg/m. If this is out of the aforementioned range listed, please consider follow up with your Primary Care Provider.  If you are age 50 or younger, your body mass index should be between 19-25. Your Body mass index is 25.58 kg/m. If this is out of the aformentioned range listed, please consider follow up with your Primary Care Provider.   ________________________________________________________  The [facility] providers would like to encourage you to use [facility] to communicate with providers for non-urgent requests or questions.  Due to long hold times on the telephone, sending your provider a message by [facility] may be a faster and more efficient way to get a response.  Please allow 48 business hours for a response.  Please remember that this is for non-urgent requests.  _______________________________________________________  Sorry we did not have your results today. As soon as we get them we will call you.   I appreciate the opportunity to care for you. Stan Head, MD,FACG

## 2021-03-26 NOTE — Progress Notes (Signed)
Lisa Baxter 45 y.o. [birth year] [id number]  Assessment & Plan:   Encounter Diagnoses  Name Primary?   Small intestinal bacterial overgrowth Yes   Irritable bowel syndrome with constipation    Bloating    Periumbilical abdominal pain    She has responded having had 2 treatment rounds of Xifaxan back in 2021, and there is a pending test.  Unfortunately I do not have those results today.  We have contacted the company but had to leave a voicemail.  I have apologized for not having these results once we get the results we will contact the patient with further plans.   [facility] has told us test was not received Given her sxs now are very much like when she had SIBO in past will retx Xifaxan x 14 d and have her see me in 2 mos and update me 2 wks after Tx  Iva Boop, MD, [facility]   Subjective:   Chief Complaint: Abdominal pain bloating gas  HPI Lisa Baxter is a 45 year old woman with a history of small intestinal bacterial overgrowth and chronic constipation that had improved after a diagnosis of SIBO and treatment.  She was seen last in June and she was having some abdominal burning symptoms but not as severe a problem and not much bloating or borborygmi or bowel issues at that time, so the plan was to repeat SIBO testing and because of persistent defecation issues to try physical therapy with attention towards the pelvic floor.  PT evaluation was carried out in August as below.  She has decided not to pursue that due to the amount of time that it requires and she is not sure if the right type of treatment for her.  She reports having repeated the lactulose hydrogen breath test but unfortunately I do not have those results and do not recall receiving those.  We have checked to see if they are in.  Waiting on a call back from the company.  The patient is frustrated that I do not have the results she is feeling poorly today with lots of borborygmi and recurrent bloating and  persistent constipation problems.  Additionally there is fatigue and back pain and her joints hurt as well.  Recently had an episode of lightheadedness.  Wonders if that and nausea that went with that is associated with her SIBO.  She is taking MiraLAX and and Amitiza she has bowel movements daily as long as she takes her medications.  However defecation is still a struggle.  Main issues today are the abdominal pain gas and bloating sensation.  PT visit 01/18/2021  Rectal       Palpation bulges but little force to expel finger, high tone and tight anal sphincters, TTP of sphincters when attempting to palpate levators     Strength weak squeeze, no lift        Pt presents to clinic due to constipation that has been chronic. Pt has weakness in hip abduction and tight rectus abdominus.  She fatigues quickly with abdominal MMT but no diastasis present.  Pt has tension in hamstrings Rt>Lt.  Pt has slightly reduced AROM lumbar flexion and tight lumbar and thoracic paraspinals. Pt was assessed rectally for pelvic floor exam and demonstrates tight sphincter muscles with tenderness to light to medium palpation.  Pt able to do 6 quick flicks with incomplete relaxation.  She can hold 2/5 MMT for 10 seconds but not steady. Pt will benefit from skilled PT to address impairments mention above and  improve ability to have BMs without straining.    No Known Allergies Current Meds  Medication Sig   CALCIUM PO Take 1 tablet by mouth daily.   Cyanocobalamin (VITAMIN B-12 PO) Take 1 tablet by mouth daily.   cyclobenzaprine (FLEXERIL) 10 MG tablet Take 10 mg by mouth 3 (three) times daily as needed for muscle spasms.   dicyclomine (BENTYL) 20 MG tablet Take 1 tablet (20 mg total) by mouth every 6 (six) hours as needed for spasms.   fluticasone (FLONASE) 50 MCG/ACT nasal spray Place 2 sprays into both nostrils daily.   ipratropium (ATROVENT) 0.03 % nasal spray Instill 2 sprays into each nostril 3 times daily as needed.    lubiprostone (AMITIZA) 8 MCG capsule Take 8 mcg by mouth 2 (two) times daily with a meal.   ondansetron (ZOFRAN ODT) 4 MG disintegrating tablet Take 1 tablet (4 mg total) by mouth every 8 (eight) hours as needed.   polyethylene glycol powder (GLYCOLAX/MIRALAX) powder Take 17 g by mouth 2 (two) times daily as needed. (Patient taking differently: Take 17 g by mouth daily.)   rizatriptan (MAXALT-MLT) 10 MG disintegrating tablet Take 1 tablet (10 mg total) by mouth as needed for migraine. May repeat in 2 hours if needed   SUMAtriptan Succinate Refill 6 MG/0.5ML SOCT INJECT 0.5ML AT ONSET OF MIGAINE. MAY REPEAT IN TWO HOURS IF HEADACHE PERSIST OR RECURS. (Patient taking differently: Inject 0.30ml at onset of migaine.  May repeat in two hours if headache persist or recurs. As needed)   Past Medical History:  Diagnosis Date   GERD (gastroesophageal reflux disease)    Irritable bowel syndrome    with constipation   Migraine headache    Sickle cell trait (HCC)    Small intestinal bacterial overgrowth 09/27/2019   SVT (supraventricular tachycardia) (HCC)    years ago now stable.   Past Surgical History:  Procedure Laterality Date   ABDOMINAL HYSTERECTOMY     partial   CHOLECYSTECTOMY     COLONOSCOPY     >10 yrs   DILATION AND CURETTAGE OF UTERUS  1999   ESOPHAGOGASTRODUODENOSCOPY     TUBAL LIGATION  2005   UTERINE FIBROID SURGERY     Social History   Social History Narrative   Lives at home with her husband and two children. 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   Drinks small amount of caffeine 2-3 times weekly.   family history includes Asthma in her mother, sister, and sister; Diabetes in her father and paternal grandmother; Hypertension in her father and mother; Prostate cancer in her father.   Review of Systems   Objective:    Physical Exam BP 116/70   Pulse 68   Ht 5\' 4"  (1.626 m)   Wt 149 lb (67.6 kg)   BMI 25.58 kg/m   Abdomen mildly protuberant soft no significant tenderness, no organomegaly or mass, bowel sounds are present.

## 2021-03-29 ENCOUNTER — Other Ambulatory Visit: Payer: Self-pay

## 2021-03-29 ENCOUNTER — Telehealth: Payer: Self-pay | Admitting: Internal Medicine

## 2021-03-29 ENCOUNTER — Other Ambulatory Visit (HOSPITAL_COMMUNITY): Payer: Self-pay

## 2021-03-29 DIAGNOSIS — K581 Irritable bowel syndrome with constipation: Secondary | ICD-10-CM

## 2021-03-29 DIAGNOSIS — K6389 Other specified diseases of intestine: Secondary | ICD-10-CM

## 2021-03-29 MED ORDER — RIFAXIMIN 550 MG PO TABS
550.0000 mg | ORAL_TABLET | Freq: Two times a day (BID) | ORAL | 0 refills | Status: AC
  Filled 2021-03-29: qty 28, 14d supply, fill #0

## 2021-03-29 NOTE — Telephone Encounter (Signed)
Pt notified of Dr. Leone Payor recommendations. Prescription was placed for pt.  Samples were available for pt. Pt made aware. Pt stated that she will pick the up tomorrow. Pt made aware  to contact us 2 weeks after her treatment is done to update [place] on sxs.  Follow Up appointment scheduled for pt on 06/01/2021 @ 9:30 with Dr. Leone Payor. Pt made aware.  Pt verbalized understanding with all questions answered.

## 2021-03-29 NOTE — Telephone Encounter (Signed)
Please contact her and explain that [facility] has told us they did not receive the breath test she performed in summer  My recommendations are:  Now that her sxs are very similar to times when we ad treated before, I recommend repeat treatment  When we ordered the test (that was not completed) her sxs were not clear  I recommend Xifaxan 550 mg tid x 14 days which has helped in past Samples if we have if not Rx that and tell her if not affordable we will try alternative med if we cannot find samples  Schedule 2 mo f/u and also tell her to update w/ sxs me 2 weeks after her Tx is done

## 2021-06-01 ENCOUNTER — Other Ambulatory Visit (INDEPENDENT_AMBULATORY_CARE_PROVIDER_SITE_OTHER): Payer: BC Managed Care – PPO

## 2021-06-01 ENCOUNTER — Ambulatory Visit (INDEPENDENT_AMBULATORY_CARE_PROVIDER_SITE_OTHER): Payer: BC Managed Care – PPO | Admitting: Internal Medicine

## 2021-06-01 ENCOUNTER — Encounter: Payer: Self-pay | Admitting: Internal Medicine

## 2021-06-01 VITALS — BP 110/78 | HR 90 | Ht 64.0 in | Wt 148.1 lb

## 2021-06-01 DIAGNOSIS — R1031 Right lower quadrant pain: Secondary | ICD-10-CM

## 2021-06-01 DIAGNOSIS — R10813 Right lower quadrant abdominal tenderness: Secondary | ICD-10-CM | POA: Diagnosis not present

## 2021-06-01 DIAGNOSIS — K6389 Other specified diseases of intestine: Secondary | ICD-10-CM

## 2021-06-01 DIAGNOSIS — K581 Irritable bowel syndrome with constipation: Secondary | ICD-10-CM

## 2021-06-01 LAB — CBC WITH DIFFERENTIAL/PLATELET
Basophils Absolute: 0 10*3/uL (ref 0.0–0.1)
Basophils Relative: 0.7 % (ref 0.0–3.0)
Eosinophils Absolute: 0.1 10*3/uL (ref 0.0–0.7)
Eosinophils Relative: 2.6 % (ref 0.0–5.0)
HCT: 41.4 % (ref 36.0–46.0)
Hemoglobin: 13.7 g/dL (ref 12.0–15.0)
Lymphocytes Relative: 39.6 % (ref 12.0–46.0)
Lymphs Abs: 1.3 10*3/uL (ref 0.7–4.0)
MCHC: 33.1 g/dL (ref 30.0–36.0)
MCV: 82.8 fl (ref 78.0–100.0)
Monocytes Absolute: 0.2 10*3/uL (ref 0.1–1.0)
Monocytes Relative: 7.5 % (ref 3.0–12.0)
Neutro Abs: 1.6 10*3/uL (ref 1.4–7.7)
Neutrophils Relative %: 49.6 % (ref 43.0–77.0)
Platelets: 238 10*3/uL (ref 150.0–400.0)
RBC: 5 Mil/uL (ref 3.87–5.11)
RDW: 13.3 % (ref 11.5–15.5)
WBC: 3.3 10*3/uL — ABNORMAL LOW (ref 4.0–10.5)

## 2021-06-01 LAB — COMPREHENSIVE METABOLIC PANEL
ALT: 10 U/L (ref 0–35)
AST: 17 U/L (ref 0–37)
Albumin: 4.1 g/dL (ref 3.5–5.2)
Alkaline Phosphatase: 77 U/L (ref 39–117)
BUN: 13 mg/dL (ref 6–23)
CO2: 28 mEq/L (ref 19–32)
Calcium: 8.8 mg/dL (ref 8.4–10.5)
Chloride: 105 mEq/L (ref 96–112)
Creatinine, Ser: 0.9 mg/dL (ref 0.40–1.20)
GFR: 76.2 mL/min (ref >60.00)
Glucose, Bld: 87 mg/dL (ref 70–99)
Potassium: 4 mEq/L (ref 3.5–5.1)
Sodium: 140 mEq/L (ref 135–145)
Total Bilirubin: 0.8 mg/dL (ref 0.2–1.2)
Total Protein: 7.2 g/dL (ref 6.0–8.3)

## 2021-06-01 LAB — SEDIMENTATION RATE: Sed Rate: 16 mm/hr (ref 0–20)

## 2021-06-01 LAB — C-REACTIVE PROTEIN: CRP: <1 mg/dL (ref 0.5–20.0)

## 2021-06-01 NOTE — Patient Instructions (Addendum)
If you are age 48 or older, your body mass index should be between 23-30. Your Body mass index is 25.43 kg/m. If this is out of the aforementioned range listed, please consider follow up with your Primary Care Provider.  If you are age 68 or younger, your body mass index should be between 19-25. Your Body mass index is 25.43 kg/m. If this is out of the aformentioned range listed, please consider follow up with your Primary Care Provider.   ________________________________________________________  The [facility] providers would like to encourage you to use [facility] to communicate with providers for non-urgent requests or questions.  Due to long hold times on the telephone, sending your provider a message by [facility] may be a faster and more efficient way to get a response.  Please allow 48 business hours for a response.  Please remember that this is for non-urgent requests.  _______________________________________________________  Dennis Bast have been scheduled for a CT scan of the abdomen and pelvis at [facility] ([address]---this is in the same building as [employer]).   You are scheduled on 06/04/2021 at 1:00pm. You should arrive 15 minutes prior to your appointment time for registration. Please follow the written instructions below on the day of your exam:  WARNING: IF YOU ARE ALLERGIC TO IODINE/X-RAY DYE, PLEASE NOTIFY RADIOLOGY IMMEDIATELY AT [phone]! YOU WILL BE GIVEN A 13 HOUR PREMEDICATION PREP.  1) Do not eat or drink anything after 9:00am (4 hours prior to your test) 2) You have been given 2 bottles of oral contrast to drink. The solution may taste better if refrigerated, but do NOT add ice or any other liquid to this solution. Shake well before drinking.    Drink 1 bottle of contrast @ 11:00am (2 hours prior to your exam)  Drink 1 bottle of contrast @ 12:00pm (1 hour prior to your exam)  You may take any medications as prescribed with a small amount of  water, if necessary. If you take any of the following medications: METFORMIN, GLUCOPHAGE, GLUCOVANCE, AVANDAMET, RIOMET, FORTAMET, [place] MET, JANUMET, GLUMETZA or METAGLIP, you MAY be asked to HOLD this medication 48 hours AFTER the exam.  The purpose of you drinking the oral contrast is to aid in the visualization of your intestinal tract. The contrast solution may cause some diarrhea. Depending on your individual set of symptoms, you may also receive an intravenous injection of x-ray contrast/dye. Plan on being at [facility] for 30 minutes or longer, depending on the type of exam you are having performed. This test typically takes 30-45 minutes to complete.  If you have any questions regarding your exam or if you need to reschedule, you may call the CT department at [phone] between the hours of 8:00 am and 5:00 pm, Monday-Friday.  ________________________________________________________________________   Use dicyclomine that you have.  Your provider has requested that you go to the basement level for lab work before leaving today. Press "B" on the elevator. The lab is located at the first door on the left as you exit the elevator.    I appreciate the opportunity to care for you. Silvano Rusk, MD, [facility]

## 2021-06-01 NOTE — Progress Notes (Signed)
Lisa Baxter 46 y.o. 15-Sep-1975 [id number]  Assessment & Plan:   Encounter Diagnoses  Name Primary?   Right lower quadrant abdominal tenderness without rebound tenderness Yes   RLQ abdominal pain    Small intestinal bacterial overgrowth    Irritable bowel syndrome with constipation     This is a little bit confusing she is tender and has some fullness in the right lower quadrant.  Treatment for SIBO x3 has caused improvement but not resolution of all symptoms and the bulk of her symptoms now do not sound like SIBO to me (bloating symptoms much better).  Perhaps this is IBS.  Previous colonoscopy did not show any signs of inflammatory bowel disease but that is possible I suppose i.e. Crohn's of the terminal ileum?  Work-up as below.  If this is all negative would consider going back on Amitiza perhaps.  She is recommended to try her dicyclomine to see if it helps.  She may continue as needed Pepto-Bismol Orders Placed This Encounter  Procedures   CT Abdomen Pelvis W Contrast   CBC with Differential/Platelet   Comprehensive metabolic panel   Sedimentation rate   C-reactive protein   CC: Lisa Melter, MD  Subjective:   Chief Complaint: Abdominal pain follow-up after retreatment of SIBO  HPI Lisa Baxter is a 46 year old woman with a history of chronic constipation, IBS-C and SIBO.  Negative screening colonoscopy in November 2021.  She has been treated 3 times now for SIBO, she has gotten benefit with reduction in bloating and some borborygmi with treatments that started in 2021 and continued into 2022.  After her second round she came back months later and in June of last year I had plan to retest her to see if the SIBO had resolved.  Unfortunately the delivery of her specimen did not occur so the lab never received that so we ended up doing another empiric treatment with Xifaxan for the third time.  That was about 2 months ago.  Bloating and abdominal distention are better but she  continues to have these episodes that last for up to a week of burning and pressure-like pain and sometimes a very sharp pains.  It occurs in "pockets".  By that she means it kind of moves around the abdomen though predominantly right lower quadrant and always in the lower abdomen.  She has tried Prilosec and IBgard and Pepto-Bismol which seems to help the most.  She is taking MiraLAX every other day moving her bowels every other day.  Recall at 1 point she was having very loose stools frequently so we stopped MiraLAX and Amitiza and she remains off the Amitiza.  She is not doing any abdominal wall exercises nor does she have any history to suggest abdominal wall strain.  Sometimes she will have some pain on the flanks radiating into the back.  There are no genitourinary symptoms.  Wt Readings from Last 3 Encounters:  06/01/21 148 lb 2 oz (67.2 kg)  03/26/21 149 lb (67.6 kg)  11/13/20 145 lb 9.6 oz (66 kg)    No Known Allergies Current Meds  Medication Sig   CALCIUM PO Take 1 tablet by mouth daily.   Cyanocobalamin (VITAMIN B-12 PO) Take 1 tablet by mouth daily.   cyclobenzaprine (FLEXERIL) 10 MG tablet Take 10 mg by mouth 3 (three) times daily as needed for muscle spasms.   dicyclomine (BENTYL) 20 MG tablet Take 1 tablet (20 mg total) by mouth every 6 (six) hours as needed for spasms.  fluticasone (FLONASE) 50 MCG/ACT nasal spray Place 2 sprays into both nostrils daily.   ipratropium (ATROVENT) 0.03 % nasal spray Instill 2 sprays into each nostril 3 times daily as needed.   ondansetron (ZOFRAN ODT) 4 MG disintegrating tablet Take 1 tablet (4 mg total) by mouth every 8 (eight) hours as needed.   polyethylene glycol powder (GLYCOLAX/MIRALAX) powder Take 17 g by mouth 2 (two) times daily as needed. (Patient taking differently: Take 17 g by mouth daily.)   rizatriptan (MAXALT-MLT) 10 MG disintegrating tablet Take 1 tablet (10 mg total) by mouth as needed for migraine. May repeat in 2 hours if needed    SUMAtriptan Succinate Refill 6 MG/0.5ML SOCT INJECT 0.5ML AT ONSET OF MIGAINE. MAY REPEAT IN TWO HOURS IF HEADACHE PERSIST OR RECURS. (Patient taking differently: Inject 0.58ml at onset of migaine.  May repeat in two hours if headache persist or recurs. As needed)   Past Medical History:  Diagnosis Date   GERD (gastroesophageal reflux disease)    Irritable bowel syndrome    with constipation   Migraine headache    Sickle cell trait (HCC)    Small intestinal bacterial overgrowth 09/27/2019   SVT (supraventricular tachycardia) (HCC)    years ago now stable.   Past Surgical History:  Procedure Laterality Date   ABDOMINAL HYSTERECTOMY     partial   CHOLECYSTECTOMY     COLONOSCOPY     >10 yrs   DILATION AND CURETTAGE OF UTERUS  1999   ESOPHAGOGASTRODUODENOSCOPY     TUBAL LIGATION  2005   UTERINE FIBROID SURGERY     Social History   Social History Narrative   Lives at home with her husband and two children. 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   Drinks small amount of caffeine 2-3 times weekly.   family history includes Asthma in her mother, sister, and sister; Diabetes in her father and paternal grandmother; Hypertension in her father and mother; Prostate cancer in her father.   Review of Systems As per HPI  Objective:   Physical Exam BP 110/78    Pulse 90    Ht 5\' 4"  (1.626 m)    Wt 148 lb 2 oz (67.2 kg)    SpO2 100%    BMI 25.43 kg/m  Back mild bilat CVAT Abd mild fullness in RLQ w/ tenderness - probably negative Carnett's but some tenderness with mm tension, BS + not increased

## 2021-06-04 ENCOUNTER — Inpatient Hospital Stay: Admission: RE | Admit: 2021-06-04 | Payer: BC Managed Care – PPO | Source: Ambulatory Visit

## 2021-06-10 ENCOUNTER — Other Ambulatory Visit: Payer: Self-pay

## 2021-06-10 ENCOUNTER — Ambulatory Visit (INDEPENDENT_AMBULATORY_CARE_PROVIDER_SITE_OTHER)
Admission: RE | Admit: 2021-06-10 | Discharge: 2021-06-10 | Disposition: A | Discharge status: Home / Self Care | Payer: BC Managed Care – PPO | Source: Ambulatory Visit | Attending: Internal Medicine | Admitting: Internal Medicine

## 2021-06-10 DIAGNOSIS — R10813 Right lower quadrant abdominal tenderness: Secondary | ICD-10-CM

## 2021-06-10 DIAGNOSIS — R109 Unspecified abdominal pain: Secondary | ICD-10-CM | POA: Diagnosis not present

## 2021-06-10 DIAGNOSIS — R1031 Right lower quadrant pain: Secondary | ICD-10-CM | POA: Diagnosis not present

## 2021-06-10 MED ORDER — IOHEXOL 300 MG/ML  SOLN
100.0000 mL | Freq: Once | INTRAMUSCULAR | Status: AC | PRN
  Administered 2021-06-10: 100 mL via INTRAVENOUS

## 2021-06-11 ENCOUNTER — Other Ambulatory Visit: Payer: Self-pay

## 2021-06-11 ENCOUNTER — Other Ambulatory Visit: Payer: Self-pay | Admitting: Internal Medicine

## 2021-06-11 ENCOUNTER — Encounter: Payer: Self-pay | Admitting: Internal Medicine

## 2021-06-11 ENCOUNTER — Other Ambulatory Visit (HOSPITAL_COMMUNITY): Payer: Self-pay

## 2021-06-11 DIAGNOSIS — K581 Irritable bowel syndrome with constipation: Secondary | ICD-10-CM

## 2021-06-11 DIAGNOSIS — R1031 Right lower quadrant pain: Secondary | ICD-10-CM

## 2021-06-11 MED ORDER — LUBIPROSTONE 8 MCG PO CAPS
8.0000 ug | ORAL_CAPSULE | Freq: Two times a day (BID) | ORAL | 2 refills | Status: DC
  Filled 2021-06-11: qty 60, 30d supply, fill #0

## 2021-06-12 ENCOUNTER — Other Ambulatory Visit (HOSPITAL_COMMUNITY): Payer: Self-pay

## 2021-06-15 ENCOUNTER — Other Ambulatory Visit (HOSPITAL_COMMUNITY): Payer: Self-pay

## 2021-06-16 ENCOUNTER — Other Ambulatory Visit (HOSPITAL_COMMUNITY): Payer: Self-pay

## 2021-06-16 MED ORDER — LINACLOTIDE 145 MCG PO CAPS
145.0000 ug | ORAL_CAPSULE | Freq: Every day | ORAL | 0 refills | Status: DC
  Filled 2021-06-16: qty 30, 30d supply, fill #0

## 2021-06-17 NOTE — Telephone Encounter (Signed)
Hi [name], I apologize for giving the incorrect sample. However, the Linzess samples that I gave her had her name on it, which I showed pt and she recognized her name and took them. Pt did not mention anything about instructions. When she comes back, I will check if the samples that I gave belong to a pt with the same name but different D.O.B, perhaps? Thank you for bringing this to my attention.

## 2021-06-17 NOTE — Telephone Encounter (Signed)
Pt stated that she went to the THIRD floor and received 4 boxes of Meds that do not have instructions.  Pt was notified that she was supposed to come to the SECOND floor to receive her medication.  Pt notified to bring the four boxes back to the second floor and we will have HER mediation in a bag with HER name on it. Pt stated that she will come back today. Pt verbalized understanding with all questions answered.

## 2021-06-30 ENCOUNTER — Telehealth: Payer: Self-pay

## 2021-06-30 NOTE — Telephone Encounter (Signed)
Follow up Phone call was made to see how pt was doing with the Linzess: Pt stated that she felt like it started out "rocky" meaning that she had dry mouth, headache, and diarrhea: Pt stated that she is doing better now with the medication and it seems to be helping some.

## 2021-07-12 ENCOUNTER — Other Ambulatory Visit (HOSPITAL_COMMUNITY): Payer: Self-pay

## 2021-07-12 DIAGNOSIS — R21 Rash and other nonspecific skin eruption: Secondary | ICD-10-CM | POA: Diagnosis not present

## 2021-07-12 MED ORDER — PREDNISONE 10 MG PO TABS
ORAL_TABLET | ORAL | 0 refills | Status: DC
  Filled 2021-07-12: qty 13, 8d supply, fill #0

## 2021-07-12 MED ORDER — TRIAMCINOLONE ACETONIDE 0.1 % EX CREA
TOPICAL_CREAM | CUTANEOUS | 0 refills | Status: DC
  Filled 2021-07-12: qty 60, 30d supply, fill #0

## 2021-09-16 DIAGNOSIS — R300 Dysuria: Secondary | ICD-10-CM | POA: Diagnosis not present

## 2021-09-21 ENCOUNTER — Telehealth: Payer: Self-pay

## 2021-09-21 ENCOUNTER — Emergency Department (INDEPENDENT_AMBULATORY_CARE_PROVIDER_SITE_OTHER): Payer: BC Managed Care – PPO

## 2021-09-21 ENCOUNTER — Telehealth: Payer: Self-pay | Admitting: Family Medicine

## 2021-09-21 ENCOUNTER — Emergency Department (INDEPENDENT_AMBULATORY_CARE_PROVIDER_SITE_OTHER)
Admission: EM | Admit: 2021-09-21 | Discharge: 2021-09-21 | Disposition: A | Discharge status: Home / Self Care | Payer: BC Managed Care – PPO | Source: Home / Self Care | Attending: Family Medicine | Admitting: Family Medicine

## 2021-09-21 DIAGNOSIS — R109 Unspecified abdominal pain: Secondary | ICD-10-CM | POA: Diagnosis not present

## 2021-09-21 DIAGNOSIS — R110 Nausea: Secondary | ICD-10-CM | POA: Diagnosis not present

## 2021-09-21 DIAGNOSIS — R3915 Urgency of urination: Secondary | ICD-10-CM

## 2021-09-21 DIAGNOSIS — R1084 Generalized abdominal pain: Secondary | ICD-10-CM

## 2021-09-21 DIAGNOSIS — K5900 Constipation, unspecified: Secondary | ICD-10-CM | POA: Diagnosis not present

## 2021-09-21 DIAGNOSIS — K581 Irritable bowel syndrome with constipation: Secondary | ICD-10-CM

## 2021-09-21 LAB — POCT URINALYSIS DIP (MANUAL ENTRY)
Bilirubin, UA: NEGATIVE
Glucose, UA: NEGATIVE mg/dL
Ketones, POC UA: NEGATIVE mg/dL
Leukocytes, UA: NEGATIVE
Nitrite, UA: NEGATIVE
Protein Ur, POC: NEGATIVE mg/dL
Spec Grav, UA: 1.01 (ref 1.010–1.025)
Urobilinogen, UA: 0.2 E.U./dL
pH, UA: 6 (ref 5.0–8.0)

## 2021-09-21 MED ORDER — HYDROCODONE-ACETAMINOPHEN 10-325 MG PO TABS: ORAL_TABLET | ORAL | 0 refills | Status: DC

## 2021-09-21 MED ORDER — HYDROCODONE-ACETAMINOPHEN 5-325 MG PO TABS: ORAL_TABLET | ORAL | 0 refills | Status: DC

## 2021-09-21 MED ORDER — LINACLOTIDE 145 MCG PO CAPS: 145.0000 ug | ORAL_CAPSULE | Freq: Every day | ORAL | 1 refills | Status: DC

## 2021-09-21 NOTE — ED Provider Notes (Signed)
?[facility] ? ? ? ?CSN: [id number] ?Arrival date & time: 09/21/21  1418 ? ? ?  ? ?History   ?Chief Complaint ?Chief Complaint  ?Patient presents with  ? Abdominal Pain  ? Nausea  ? ? ?HPI ?Lisa Baxter is a 46 y.o. female.  ? ?Patient complains of onset of lower abdominal pressure/pain, nausea (without vomiting), fatigue, and urinary urgency one week ago.  She was evaluated in an urgent care where she was empirically treated for UTI with Bactrim DS based on her symptoms (urinalysis was unremarkable).  Her symptoms have not improved.  She also complains of bilateral flank pain, worse on the right.  She has had intermittent chills during the past 6 days.  Her last normal bowel movement was one week ago, although she had a small hard bowel movement yesterday.  She has a history of chronic constipation, having had a good response to Linzess daily.  However, she has not taken this medication for about 2 months.  She takes Miralax on an intermittent schedule.  She also states that she had intermittent shooting sensations in her arms without chest pain or neurologic symptoms, but is assymptomatic at present. ? ?She has a past surgical history of cholecystectomy and hysterectomy ? ?The history is provided by the patient.  ?Abdominal Pain ?Pain location:  Suprapubic, R flank and L flank ?Pain quality: pressure   ?Pain radiates to:  Does not radiate ?Pain severity:  Mild ?Onset quality:  Gradual ?Duration:  1 week ?Progression:  Unchanged ?Chronicity:  New ?Context: awakening from sleep, eating and previous surgery   ?Context: not diet changes, not recent illness, not recent travel and not trauma   ?Relieved by:  Nothing ?Worsened by:  Movement, position changes and eating ?Ineffective treatments:  None tried ?Associated symptoms: anorexia, chills, constipation, fatigue and nausea   ?Associated symptoms: no chest pain, no cough, no diarrhea, no dysuria, no hematemesis, no hematochezia, no hematuria, no  melena, no shortness of breath and no vomiting   ? ?Past Medical History:  ?Diagnosis Date  ? GERD (gastroesophageal reflux disease)   ? Irritable bowel syndrome   ? with constipation  ? Migraine headache   ? Sickle cell trait (HCC)   ? Small intestinal bacterial overgrowth 09/27/2019  ? SVT (supraventricular tachycardia) (HCC)   ? years ago now stable.  ? ? ?Patient Active Problem List  ? Diagnosis Date Noted  ? Irritable bowel syndrome with constipation 11/13/2020  ? Small intestinal bacterial overgrowth 09/27/2019  ? Chronic migraine 06/23/2015  ? ? ?Past Surgical History:  ?Procedure Laterality Date  ? ABDOMINAL HYSTERECTOMY    ? partial  ? CHOLECYSTECTOMY    ? COLONOSCOPY    ? >10 yrs  ? DILATION AND CURETTAGE OF UTERUS  1999  ? ESOPHAGOGASTRODUODENOSCOPY    ? TUBAL LIGATION  2005  ? UTERINE FIBROID SURGERY    ? ? ?OB History   ?No obstetric history on file. ?  ? ? ? ?Home Medications   ? ?Prior to Admission medications   ?Medication Sig Start Date End Date Taking? Authorizing Provider  ?HYDROcodone-acetaminophen (NORCO/VICODIN) 5-325 MG tablet Take one by mouth at bedtime as needed for pain 09/21/21  Yes Lattie Haw, MD  ?CALCIUM PO Take 1 tablet by mouth daily.    [provider]  ?Cyanocobalamin (VITAMIN B-12 PO) Take 1 tablet by mouth daily.    [provider]  ?cyclobenzaprine (FLEXERIL) 10 MG tablet Take 10 mg by mouth 3 (three) times  daily as needed for muscle spasms.    [provider]  ?dicyclomine (BENTYL) 20 MG tablet Take 1 tablet (20 mg total) by mouth every 6 (six) hours as needed for spasms. 11/13/20   Iva Boop, MD  ?fluticasone Aleda Grana) 50 MCG/ACT nasal spray Place 2 sprays into both nostrils daily. 02/11/20   Couture, Cortni S, PA-C  ?ipratropium (ATROVENT) 0.03 % nasal spray Instill 2 sprays into each nostril 3 times daily as needed. 01/29/21     ?linaclotide (LINZESS) 145 MCG CAPS capsule Take 1 capsule (145 mcg total) by mouth daily before breakfast. 09/21/21    Lattie Haw, MD  ?ondansetron (ZOFRAN ODT) 4 MG disintegrating tablet Take 1 tablet (4 mg total) by mouth every 8 (eight) hours as needed. 03/29/19   Lurene Shadow, PA-C  ?phenazopyridine (PYRIDIUM) 100 MG tablet Take 100 mg by mouth 3 (three) times daily as needed. 09/16/21   [provider]  ?polyethylene glycol powder (GLYCOLAX/MIRALAX) powder Take 17 g by mouth 2 (two) times daily as needed. ?Patient taking differently: Take 17 g by mouth daily. 06/01/17   Dorena Bodo, NP  ?predniSONE (DELTASONE) 10 MG tablet Take 3 tablets by mouth once a day for 2 days, then take 2 tablets by mouth  for 2 days, then take 1 tablet by mouth for 2 days, then take 1/2 tablet by mouth for 2 days 07/12/21     ?rizatriptan (MAXALT-MLT) 10 MG disintegrating tablet Take 1 tablet (10 mg total) by mouth as needed for migraine. May repeat in 2 hours if needed 08/26/19   Lomax, Amy, NP  ?sulfamethoxazole-trimethoprim (BACTRIM DS) 800-160 MG tablet SMARTSIG:1 Tablet(s) By Mouth Every 12 Hours 09/16/21   [provider]  ?SUMAtriptan Succinate Refill 6 MG/0.5ML SOCT INJECT 0.5ML AT ONSET OF MIGAINE. MAY REPEAT IN TWO HOURS IF HEADACHE PERSIST OR RECURS. ?Patient taking differently: Inject 0.3ml at onset of migaine.  May repeat in two hours if headache persist or recurs. ?As needed 08/31/17   Butch Penny, NP  ?triamcinolone cream (KENALOG) 0.1 % Apply onto the skin 2 times a day 07/12/21     ? ? ?Family History ?Family History  ?Problem Relation Age of Onset  ? Prostate cancer Father   ? Hypertension Father   ? Diabetes Father   ? Hypertension Mother   ? Asthma Mother   ? Asthma Sister   ? Diabetes Paternal Grandmother   ? Asthma Sister   ? Breast cancer Neg Hx   ? Colon cancer Neg Hx   ? Stomach cancer Neg Hx   ? Pancreatic cancer Neg Hx   ? Esophageal cancer Neg Hx   ? Rectal cancer Neg Hx   ? Colon polyps Neg Hx   ? ? ?Social History ?Social History  ? ?Tobacco Use  ? Smoking status: Never  ? Smokeless tobacco:  Never  ?Vaping Use  ? Vaping Use: Never used  ?Substance Use Topics  ? Alcohol use: Yes  ?  Alcohol/week: 0.0 standard drinks  ?  Comment: 2- 3 times weekly - small amount  ? Drug use: No  ? ? ? ?Allergies   ?Patient has no known allergies. ? ? ?Review of Systems ?Review of Systems  ?Constitutional:  Positive for activity change, appetite change, chills and fatigue.  ?HENT: Negative.    ?Eyes: Negative.   ?Respiratory:  Negative for cough and shortness of breath.   ?Cardiovascular:  Negative for chest pain, palpitations and leg swelling.  ?Gastrointestinal:  Positive for abdominal  distention, abdominal pain, anorexia, constipation and nausea. Negative for blood in stool, diarrhea, hematemesis, hematochezia, melena and vomiting.  ?Genitourinary:  Positive for flank pain and urgency. Negative for difficulty urinating, dysuria, frequency, hematuria and pelvic pain.  ?Skin: Negative.   ?Neurological:  Positive for headaches.  ?Hematological:  Negative for adenopathy.  ? ? ?Physical Exam ?Triage Vital Signs ?ED Triage Vitals  ?Enc Vitals Group  ?   BP 09/21/21 1457 122/82  ?   Pulse Rate 09/21/21 1457 97  ?   Resp 09/21/21 1457 18  ?   Temp 09/21/21 1457 98.6 ?F (37 ?C)  ?   Temp Source 09/21/21 1457 Oral  ?   SpO2 09/21/21 1457 97 %  ?   Weight --   ?   Height --   ?   Head Circumference --   ?   Peak Flow --   ?   Pain Score 09/21/21 1459 0  ?   Pain Loc --   ?   Pain Edu? --   ?   Excl. in GC? --   ? ?No data found. ? ?Updated Vital Signs ?BP 122/82 (BP Location: Right Arm)   Pulse 97   Temp 98.6 ?F (37 ?C) (Oral)   Resp 18   SpO2 97%  ? ?Visual Acuity ?Right Eye Distance:   ?Left Eye Distance:   ?Bilateral Distance:   ? ?Right Eye Near:   ?Left Eye Near:    ?Bilateral Near:    ? ?Physical Exam ?Vitals and nursing note reviewed.  ?Constitutional:   ?   General: She is not in acute distress. ?   Appearance: She is not ill-appearing.  ?HENT:  ?   Head: Normocephalic.  ?   Mouth/Throat:  ?   Mouth: Mucous membranes  are moist.  ?   Pharynx: Oropharynx is clear.  ?Eyes:  ?   Extraocular Movements: Extraocular movements intact.  ?   Pupils: Pupils are equal, round, and reactive to light.  ?Cardiovascular:  ?   Rate and

## 2021-09-21 NOTE — ED Triage Notes (Addendum)
Pt c/o abdominal pain and nausea since last Tuesday. Seen at another UC on Thurs 4/20, was rx BACTRIM and Pyridium. No UCX results yet. Also c/o headache and chills. States yesterday she has a BM which was first in 1 week. Pt also states she been having bilateral arm , shooting sensation since Tuesday. ?

## 2021-09-21 NOTE — Telephone Encounter (Signed)
Pt called stating pharm is out of meds sent by Dr Assunta Found. Per pharmacy have 10-325 in stock. Informed pt will send in new dose,. ?

## 2021-09-21 NOTE — Discharge Instructions (Signed)
Resume Linzess. ?Resume taking daily Miralax, approximately 1/4 to 1/2 capful mixed in 4 to 8 ounces of water until bowel movements are regular. May resume stool softener such as Colace. ? ?If symptoms become significantly worse during the night or over the weekend, proceed to the local emergency room.  ? ?

## 2021-09-21 NOTE — Telephone Encounter (Signed)
Pharmacy does not have Vicodin 5-325.  However, 10-325 is available. ?Will Rx #3 tabs (no refill); take 1/2 PO HS PRN ?

## 2021-09-22 LAB — CBC WITH DIFFERENTIAL/PLATELET
Absolute Monocytes: 418 cells/uL (ref 200–950)
Basophils Absolute: 20 cells/uL (ref 0–200)
Basophils Relative: 0.8 %
Eosinophils Absolute: 173 cells/uL (ref 15–500)
Eosinophils Relative: 6.9 %
HCT: 39.7 % (ref 35.0–45.0)
Hemoglobin: 12.9 g/dL (ref 11.7–15.5)
Lymphs Abs: 915 cells/uL (ref 850–3900)
MCH: 27.6 pg (ref 27.0–33.0)
MCHC: 32.5 g/dL (ref 32.0–36.0)
MCV: 85 fL (ref 80.0–100.0)
MPV: 13 fL — ABNORMAL HIGH (ref 7.5–12.5)
Monocytes Relative: 16.7 %
Neutro Abs: 975 cells/uL — ABNORMAL LOW (ref 1500–7800)
Neutrophils Relative %: 39 %
Platelets: 221 10*3/uL (ref 140–400)
RBC: 4.67 10*6/uL (ref 3.80–5.10)
RDW: 12.8 % (ref 11.0–15.0)
Total Lymphocyte: 36.6 %
WBC: 2.5 10*3/uL — ABNORMAL LOW (ref 3.8–10.8)

## 2021-09-22 LAB — T4, FREE: Free T4: 0.9 ng/dL (ref 0.8–1.8)

## 2021-09-22 LAB — TSH: TSH: 2.38 mIU/L

## 2021-09-23 LAB — URINE CULTURE
MICRO NUMBER:: 13312708
Result:: NO GROWTH
SPECIMEN QUALITY:: ADEQUATE

## 2021-09-24 DIAGNOSIS — D709 Neutropenia, unspecified: Secondary | ICD-10-CM | POA: Diagnosis not present

## 2021-09-24 DIAGNOSIS — K5904 Chronic idiopathic constipation: Secondary | ICD-10-CM | POA: Diagnosis not present

## 2021-09-24 DIAGNOSIS — B349 Viral infection, unspecified: Secondary | ICD-10-CM | POA: Diagnosis not present

## 2021-09-24 DIAGNOSIS — R21 Rash and other nonspecific skin eruption: Secondary | ICD-10-CM | POA: Diagnosis not present

## 2021-10-06 DIAGNOSIS — D709 Neutropenia, unspecified: Secondary | ICD-10-CM | POA: Diagnosis not present

## 2021-10-06 DIAGNOSIS — R21 Rash and other nonspecific skin eruption: Secondary | ICD-10-CM | POA: Diagnosis not present

## 2022-02-17 ENCOUNTER — Other Ambulatory Visit: Payer: Self-pay

## 2022-06-12 IMAGING — DX DG ABDOMEN 2V
3 series · 3 of 3 positions shown · non-contrast
Comparison: 07/25/2019

CLINICAL DATA: Abdominal pain, nausea

EXAM:
ABDOMEN - 2 VIEW

[abdomen erect]
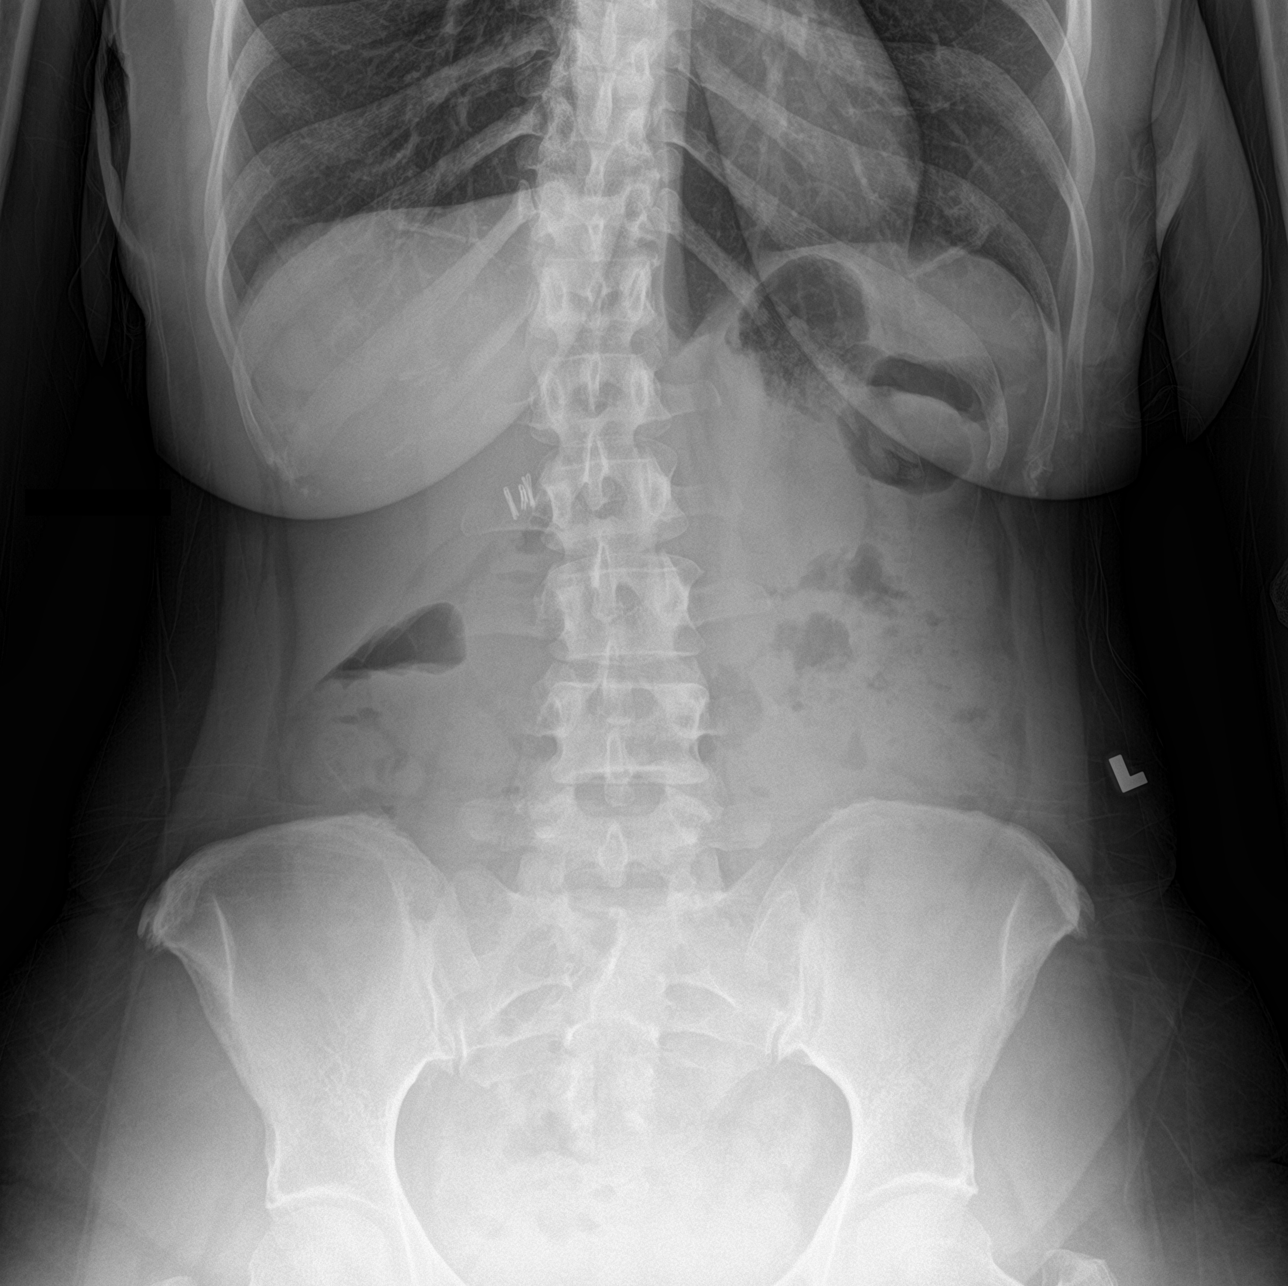

[abdomen supine (1 of 2)]
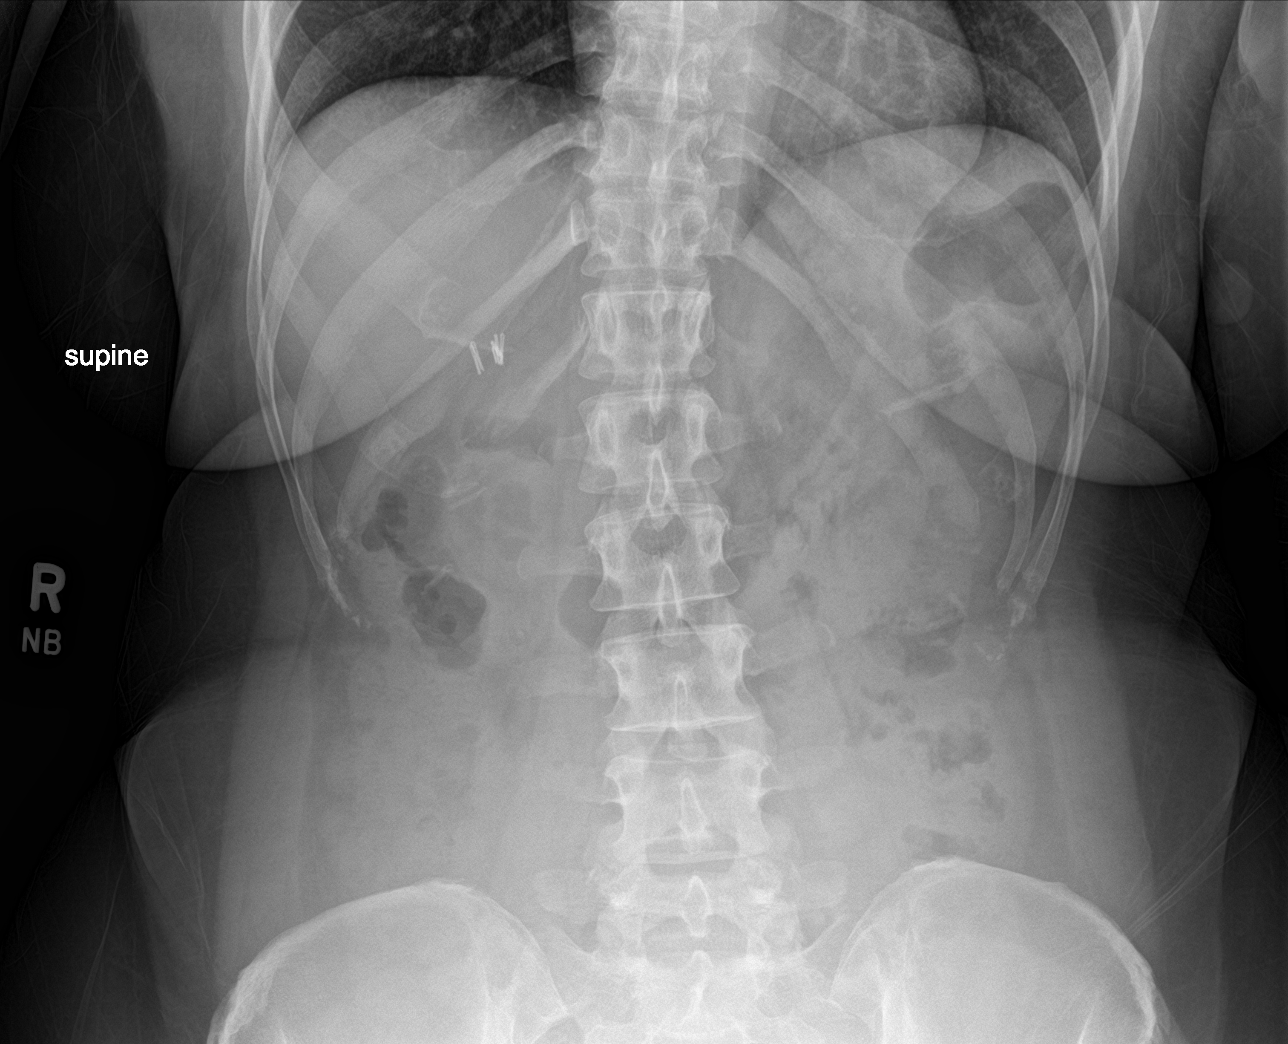

[abdomen supine (2 of 2)]
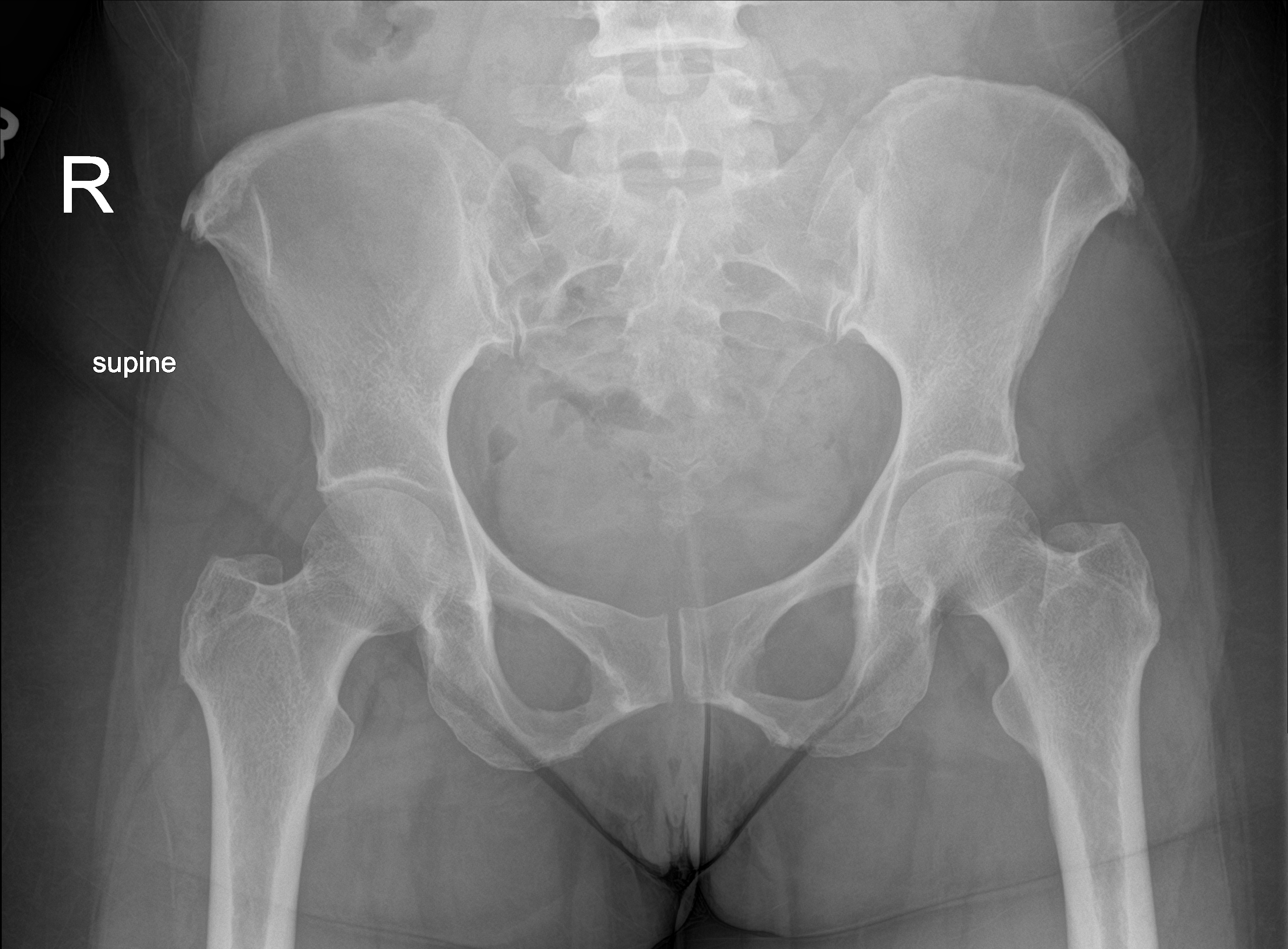

[3 of 3 positions shown; findings below may reference images not displayed]

FINDINGS: The bowel gas pattern is normal. There is no evidence of free air.
Cholecystectomy clips are seen in the right upper quadrant. No
radio-opaque calculi or other significant radiographic abnormality
is seen.
IMPRESSION: Negative.

## 2022-09-10 ENCOUNTER — Ambulatory Visit
Admission: EM | Admit: 2022-09-10 | Discharge: 2022-09-10 | Disposition: A | Discharge status: Home / Self Care | Payer: BC Managed Care – PPO | Attending: Family Medicine | Admitting: Family Medicine

## 2022-09-10 ENCOUNTER — Encounter: Payer: Self-pay | Admitting: Emergency Medicine

## 2022-09-10 DIAGNOSIS — R109 Unspecified abdominal pain: Secondary | ICD-10-CM | POA: Diagnosis not present

## 2022-09-10 MED ORDER — IBUPROFEN 800 MG PO TABS: 800.0000 mg | ORAL_TABLET | Freq: Three times a day (TID) | ORAL | 0 refills | Status: DC

## 2022-09-10 MED ORDER — ONDANSETRON 4 MG PO TBDP
4.0000 mg | ORAL_TABLET | Freq: Once | ORAL | Status: AC
  Administered 2022-09-10: 4 mg via ORAL

## 2022-09-10 NOTE — ED Notes (Signed)
Patient is being discharged from the Urgent Care and sent to the Emergency Department via pov . Per Dr Delton See, patient is in need of higher level of care due to 9/10 pain. Patient is aware and verbalizes understanding of plan of care.  Vitals:   09/10/22 1526  BP: 138/83  Pulse: 74  Resp: 16  Temp: 97.6 F (36.4 C)  SpO2: 100%   Report called to Arlyss Repress, RN at [facility] ED

## 2022-09-10 NOTE — ED Triage Notes (Signed)
Abdominal pain x 3 days w/  nausea  Started Thursday night  Pain starts midline and radiates around to right side of back  Bloating per pt  Advil yesterday for HA Denies fever

## 2022-09-10 NOTE — Discharge Instructions (Addendum)
Go to ER

## 2022-09-11 ENCOUNTER — Telehealth: Payer: Self-pay | Admitting: Emergency Medicine

## 2022-09-11 NOTE — ED Provider Notes (Signed)
[facility]    CSN: [id number] Arrival date & time: 09/10/22  1516      History   Chief Complaint Chief Complaint  Patient presents with   Abdominal Pain    HPI Lisa Baxter is a 47 y.o. female.   HPI Patent.  Since with abdominal pain.  She has chronic abdominal pain from IBS with constipation.  She is under the care of a gastroenterologist.  She states that she has a CAT scan pending but this has been delayed due to insurance approval.  She is here because she has an increase in her pain.  Right lower quadrant pain.  Nausea.  She states the pain is a 9/10 in severity.  It feels worse today than usual.  Last bowel movement norma, yesterday.  Past Medical History:  Diagnosis Date   GERD (gastroesophageal reflux disease)    Irritable bowel syndrome    with constipation   Migraine headache    Sickle cell trait    Small intestinal bacterial overgrowth 09/27/2019   SVT (supraventricular tachycardia)    years ago now stable.    Patient Active Problem List   Diagnosis Date Noted   Irritable bowel syndrome with constipation 11/13/2020   Small intestinal bacterial overgrowth 09/27/2019   Chronic migraine 06/23/2015    Past Surgical History:  Procedure Laterality Date   ABDOMINAL HYSTERECTOMY     partial   CHOLECYSTECTOMY     COLONOSCOPY     >10 yrs   DILATION AND CURETTAGE OF UTERUS  1999   ESOPHAGOGASTRODUODENOSCOPY     TUBAL LIGATION  2005   UTERINE FIBROID SURGERY      OB History   No obstetric history on file.      Home Medications    Prior to Admission medications   Medication Sig Start Date End Date Taking? Authorizing Provider  CALCIUM PO Take 1 tablet by mouth daily.    [provider]  Cyanocobalamin (VITAMIN B-12 PO) Take 1 tablet by mouth daily.    [provider]  polyethylene glycol powder (GLYCOLAX/MIRALAX) powder Take 17 g by mouth 2 (two) times daily as needed. Patient taking differently: Take 17 g by mouth  daily. 06/01/17   Dorena Bodo, NP  SUMAtriptan Succinate Refill 6 MG/0.5ML SOCT INJECT 0.5ML AT ONSET OF MIGAINE. MAY REPEAT IN TWO HOURS IF HEADACHE PERSIST OR RECURS. Patient taking differently: Inject 0.40ml at onset of migaine.  May repeat in two hours if headache persist or recurs. As needed 08/31/17   Butch Penny, NP    Family History Family History  Problem Relation Age of Onset   Prostate cancer Father    Hypertension Father    Diabetes Father    Hypertension Mother    Asthma Mother    Asthma Sister    Diabetes Paternal Grandmother    Asthma Sister    Breast cancer Neg Hx    Colon cancer Neg Hx    Stomach cancer Neg Hx    Pancreatic cancer Neg Hx    Esophageal cancer Neg Hx    Rectal cancer Neg Hx    Colon polyps Neg Hx     Social History Social History   Tobacco Use   Smoking status: Never   Smokeless tobacco: Never  Vaping Use   Vaping Use: Never used  Substance Use Topics   Alcohol use: Yes    Alcohol/week: 0.0 standard drinks of alcohol    Comment: 2- 3 times weekly - small amount  Drug use: No     Allergies   Patient has no known allergies.   Review of Systems Review of Systems See HPI  Physical Exam Triage Vital Signs ED Triage Vitals  Enc Vitals Group     BP 09/10/22 1526 138/83     Pulse Rate 09/10/22 1526 74     Resp 09/10/22 1526 16     Temp 09/10/22 1526 97.6 F (36.4 C)     Temp Source 09/10/22 1526 Oral     SpO2 09/10/22 1526 100 %     Weight 09/10/22 1529 146 lb (66.2 kg)     Height --      Head Circumference --      Peak Flow --      Pain Score 09/10/22 1528 9     Pain Loc --      Pain Edu? --      Excl. in GC? --    No data found.  Updated Vital Signs BP 138/83 (BP Location: Right Arm)   Pulse 74   Temp 97.6 F (36.4 C) (Oral)   Resp 16   Wt 66.2 kg   SpO2 100%   BMI 25.06 kg/m     Physical examination  Patient is acutely uncomfortable.  Walks in a slightly flexed position holding her abdomen.  Heart  and lung exam is normal.  Her abdomen is tender in the right lower quadrant to even light palpation with grimacing and vocalization.  She has guarding.  Questionable rebound.    UC Treatments / Results  Labs (all labs ordered are listed, but only abnormal results are displayed) Labs Reviewed - No data to display  EKG   Radiology No results found.  Procedures Procedures (including critical care time)  Medications Ordered in UC Medications  ondansetron (ZOFRAN-ODT) disintegrating tablet 4 mg (4 mg Oral Given 09/10/22 1534)    Initial Impression / Assessment and Plan / UC Course  I have reviewed the triage vital signs and the nursing notes.  Pertinent labs & imaging results that were available during my care of the patient were reviewed by me and considered in my medical decision making (see chart for details).     Sent to ER for further evaluation patient needs a higher level of care including abdominal imaging and laboratory work Final Clinical Impressions(s) / UC Diagnoses   Final diagnoses:  Acute abdominal pain     Discharge Instructions      Go to ER    ED Prescriptions     Medication Sig Dispense Auth. Provider      PDMP not reviewed this encounter.   Eustace Moore, MD 09/11/22 [id number]
